# Patient Record
Sex: Male | Born: 1995
Health system: Southern US, Community
[De-identification: ages and names within clinical notes are randomized; demographics above are authoritative.]

## PROBLEM LIST (undated history)

## (undated) DIAGNOSIS — E11649 Type 2 diabetes mellitus with hypoglycemia without coma: Secondary | ICD-10-CM

## (undated) DIAGNOSIS — E063 Autoimmune thyroiditis: Secondary | ICD-10-CM

## (undated) DIAGNOSIS — R6250 Unspecified lack of expected normal physiological development in childhood: Secondary | ICD-10-CM

## (undated) DIAGNOSIS — E049 Nontoxic goiter, unspecified: Secondary | ICD-10-CM

## (undated) HISTORY — DX: Unspecified lack of expected normal physiological development in childhood: R62.50

## (undated) HISTORY — DX: Type 2 diabetes mellitus with hypoglycemia without coma: E11.649

## (undated) HISTORY — PX: CIRCUMCISION: SUR203

## (undated) HISTORY — DX: Nontoxic goiter, unspecified: E04.9

## (undated) HISTORY — DX: Autoimmune thyroiditis: E06.3

---
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
## 2016-01-09 ENCOUNTER — Ambulatory Visit: Payer: BLUE CROSS/BLUE SHIELD | Admitting: Family

## 2016-01-16 ENCOUNTER — Encounter: Payer: Self-pay | Admitting: Family

## 2016-01-16 ENCOUNTER — Ambulatory Visit (INDEPENDENT_AMBULATORY_CARE_PROVIDER_SITE_OTHER): Payer: BLUE CROSS/BLUE SHIELD | Admitting: Family

## 2016-01-16 VITALS — BP 132/83 | HR 73 | Wt 164.2 lb

## 2016-01-16 DIAGNOSIS — E109 Type 1 diabetes mellitus without complications: Secondary | ICD-10-CM | POA: Diagnosis not present

## 2016-01-16 DIAGNOSIS — F329 Major depressive disorder, single episode, unspecified: Secondary | ICD-10-CM | POA: Diagnosis not present

## 2016-01-16 DIAGNOSIS — F54 Psychological and behavioral factors associated with disorders or diseases classified elsewhere: Secondary | ICD-10-CM | POA: Diagnosis not present

## 2016-01-16 DIAGNOSIS — R030 Elevated blood-pressure reading, without diagnosis of hypertension: Secondary | ICD-10-CM | POA: Diagnosis not present

## 2016-01-16 DIAGNOSIS — F32A Depression, unspecified: Secondary | ICD-10-CM

## 2016-01-16 DIAGNOSIS — E1065 Type 1 diabetes mellitus with hyperglycemia: Principal | ICD-10-CM

## 2016-01-16 DIAGNOSIS — IMO0001 Reserved for inherently not codable concepts without codable children: Secondary | ICD-10-CM

## 2016-01-16 LAB — POCT GLYCOSYLATED HEMOGLOBIN (HGB A1C): HEMOGLOBIN A1C: 11.8

## 2016-01-16 LAB — GLUCOSE, POCT (MANUAL RESULT ENTRY): POC Glucose: 270 mg/dl — AB (ref 70–99)

## 2016-01-16 MED ORDER — LISINOPRIL 5 MG PO TABS: 5.0000 mg | ORAL_TABLET | Freq: Every day | ORAL | Status: DC

## 2016-01-16 NOTE — Progress Notes (Signed)
Subjective:  Patient Name: [name] Date of Birth: [birth year]  MRN: [id number]  [name]  presents to the office today for follow-up and management of his type 1 diabetes, hypoglycemia, goiter, growth delay,  non-compliance, and depression.  HISTORY OF PRESENT ILLNESS:   [name] is a 20 y.o. African-American young man.   [name] was accompanied by his mother  1. [name] was admitted to the pediatric intensive care unit of [facility] on 09/22/2006 for evaluation and treatment of new-onset type 1 diabetes mellitus, diabetic ketoacidosis, dehydration, and weight loss. He was started on Lantus as a basal insulin and Novolog aspart as a bolus insulin at mealtimes, bedtime, and 2 AM. He received additional diabetes education through our Diabetes Survival Skills Program and the [facility]. He was subsequently converted to an [employer] insulin pump.  2. The patient's last PSSG visit was on 12/06/2015. [name] states that he is very happy today and proud of himself because he has been working hard to meet the goals we set at his last visit. He states that he is very motivated to improve his diabetes and has been putting in a lot of effort to do better. He is checking his blood sugar more and bolusing with all of his meals, he is also changing his pump site more regularly. He admits that he still has a long way to goal but he is happy with his progress. [name] reports that he seems to run highest around dinner time. He eats pasta/pizza/burgers for lunch and for dinner and he knows that is a big reason of why his blood sugars stay elevated. [name] reports that he is not taking the Lisinopril that is prescribed for him and he does not understand why he needs to take it, but he would be open to taking it.   Pump settings:  Basal: 12am: 1.25 4am: 1.75 8am: 1.85 2pm: 1.25 8pm: 1.40  I:C ratio 12am: 20 5am; 12 1030am: 10  Sensitivity 12am: 80 5am: 40 1030am; 30 4pm:  50 7pm: 50  3. Pertinent Review of Systems:  Constitutional: The patient said that he feels "a lot better actually". Eyes: Vision seems to be good with his glasses. There are no recognized eye problems. His last eye exam was in July of 2015. There were no signs of diabetic eye damage. Eye exam upcoming  Neck: The patient has no complaints of anterior neck swelling, soreness, tenderness, pressure, discomfort, or difficulty swallowing.   Heart: Heart rate increases with exercise or other physical activity. The patient has no complaints of palpitations, irregular heart beats, chest pain, or chest pressure.   Gastrointestinal: Bowel movents seem normal. The patient has no complaints of excessive hunger, acid reflux, upset stomach, stomach aches or pains, diarrhea, or constipation.  Legs: Denies leg cramps. There are no other complaints of numbness, tingling, burning, or pain. No edema is noted.  Feet: There are no obvious foot problems. There are no complaints of numbness, tingling, burning, or pain. No edema is noted. Neurologic: There are no recognized problems with muscle movement and strength, sensation, or coordination. Hypoglycemia: He has been having many low BGs recently, usually during physical activity.   Psych: [name] is still unhappy about having DM.   Diabetes ID: Owns dog tag- at home.    4. BG printout: Checking Bg 2.1 times per day. He has 2 days with no checks. Avg Bg 227. He is changing his pump site every three days. He is bolusing  3 times per day and is getting 60% basal.  Last visit: Checking Bg 0.2 times per day. 25 days with no sugars out of the last 28 days. Bolusing 2.6 times per day. Avg Bg 307+-94.     PAST MEDICAL, FAMILY, AND SOCIAL HISTORY  Past Medical History  Diagnosis Date  . Diabetes mellitus   . Hypoglycemia associated with diabetes (HCC)   . Goiter   . Physical growth delay   . Hypoglycemia associated with diabetes (HCC)   . Goiter   . Thyroiditis,  autoimmune     Family History  Problem Relation Age of Onset  . Diabetes Paternal Grandmother   . Heart disease Paternal Grandmother   . Diabetes Paternal Grandfather   . Heart disease Paternal Grandfather   . Cancer Paternal Grandfather      Current outpatient prescriptions:  .  glucagon (GLUCAGON EMERGENCY) 1 MG injection, Inject 1 mg intramuscularly in thigh one time for severe hypoglycemia (Patient taking differently: Inject 1 mg into the muscle once as needed (for severe hypoglycemia). ), Disp: 2 each, Rfl: 3 .  glucose blood test strip, Use as instructed to check up to 8-10 times a day (Patient taking differently: 1 each by Other route See admin instructions. Pt is instructed to check his blood sugar eight to ten times per day.), Disp: 300 each, Rfl: 6 .  insulin aspart (NOVOLOG) 100 UNIT/ML injection, 300 units in insulin pump every 48 hours (Patient taking differently: 300 Units by Other route every other day. Pt uses in insulin pump.), Disp: 12 vial, Rfl: 4 .  Lancets (ACCU-CHEK MULTICLIX) lancets, Use to test blood sugars 10 x daily (Patient taking differently: 1 each by Other route See admin instructions. Pt uses to test his blood sugars eight to ten times per day.), Disp: 100 each, Rfl: 6 .  lisinopril (PRINIVIL,ZESTRIL) 5 MG tablet, Take 1 tablet (5 mg total) by mouth daily., Disp: 30 tablet, Rfl: 11  Allergies as of 01/16/2016  . (No Known Allergies)     reports that he has never smoked. He has never used smokeless tobacco. He reports that he does not drink alcohol or use illicit drugs. Pediatric History  Patient Guardian Status  . Mother:  Moe,[name]  . Father:  Dade,[name]   Other Topics Concern  . Not on file   Social History Narrative    1. School and family: Freshman at [employer] 2. Activities: Weight lifting at the gym  4. Primary Care Provider: Duard Brady, MD 5. Adolescent medicine: Cresenciano Genre- hasn't seen in over a year.   REVIEW OF SYSTEMS: There  are no other significant problems involving [name]'s other body systems.   Objective:  Vital Signs:  BP 132/83 mmHg  Pulse 73  Wt 74.481 kg (164 lb 3.2 oz)  No height on file for this encounter.  Ht Readings from Last 3 Encounters:  07/12/15  (1.753 m) (45 %*, Z = -0.12)  06/28/15  (1.753 m) (45 %*, Z = -0.12)  06/13/15 5' 9.37" (1.762 m) (51 %*, Z = 0.02)   * Growth percentiles are based on CDC 2-20 Years data.   Wt Readings from Last 3 Encounters:  01/16/16 74.481 kg (164 lb 3.2 oz) (70 %*, Z = 0.53)  12/06/15 72.394 kg (159 lb 9.6 oz) (65 %*, Z = 0.38)  07/12/15 72.576 kg (160 lb) (68 %*, Z = 0.47)   * Growth percentiles are based on CDC 2-20 Years data.   HC Readings from Last  3 Encounters:  No data found for HC   There is no height on file to calculate BSA. No height on file for this encounter. 70%ile (Z=0.53) based on CDC 2-20 Years weight-for-age data using vitals from 01/16/2016.    PHYSICAL EXAM:  Constitutional: The patient appears generally healthy. He is interactive today in clinic and happy about the progress he has made.  Head: The head is normocephalic. Face: The face appears normal. There are no obvious dysmorphic features. Eyes: There is no obvious arcus or proptosis. Moisture appears low. Mouth: The oropharynx and tongue appear normal. Dentition appears to be normal for age. Oral moisture is decreased.  Neck: The neck appears to be visibly enlarged. The thyroid gland is about 22-23 gms in size. Both lobes are enlarged symmetrically. The thyroid gland is non-tender today. Lungs: The lungs are clear to auscultation. Air movement is good Heart: Heart rate and rhythm are regular. Heart sounds S1 and S2 are normal. I did not appreciate any pathologic cardiac murmurs. Abdomen: The abdomen is normal in size for the patient's age. Bowel sounds are normal. There is no obvious hepatomegaly, splenomegaly, or other mass effect.  Arms: Muscle size and bulk are  normal for age. Hands: There is no obvious tremor. Phalangeal and metacarpophalangeal joints are normal. Palmar muscles are normal for age. Palmar skin is normal. Palmar moisture is also normal. Legs: Muscles appear normal for age. No edema is present. Feet: Feet are normally formed. His dorsalis pedal pulses are normal 2+. Neurologic: Strength is normal for age in both the upper and lower extremities. Muscle tone is normal. Sensation to touch is normal in both the legs and feet.    LAB DATA:  Results for orders placed or performed in visit on 01/16/16  POCT Glucose (CBG)  Result Value Ref Range   POC Glucose 270 (A) 70 - 99 mg/dl  POCT HgB Z6X  Result Value Ref Range   Hemoglobin A1C 11.8       Assessment and Plan:   ASSESSMENT:  1. Type 1 diabetes on pump: Poor control. Farmer is working hard to make improvements in his blood glucose control. He greatly increased his blood sugar checking and consistency in his care. He is open to using any tool available to him for help improve his care.  3. Goiter/thyroiditis: Thyroid is enlarged but non-tender today.  4. Growth delay: Growth complete.   5. Weight loss, unintentional: Gained 5 pounds since last visit. He is getting more insulin now.  6. Depression: This problem is much improved. He still does not like to have DM.  7. Maladaptive Behavior: Markee is making improvements in his diabetes care and seems to be motivated to make significant changes.  8. Elevated blood pressure- Continues to have elevated blood pressures. Was started on Lisinopril but is not currently taking it.    PLAN:  1. Diagnostic: Labs as above. Continue home glucose monitoring. 2. Therapeutic: Increase glucose checks to 3-4 per day, no days without checks. Try using dual bolus on pump when eating high carb/fatty meals.  Pump changes  Sensitivity changes  - 1600: 50--> 30   - 1900: 50--> 35 3. Patient education: Discussed challenges with diabetes care. 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 Discussed using Dual Wave bolus and how it effects insulin delivery for certain types of meals. Discussed importance of taking Lisinopril daily and why he needs it. Created goals.  4. Follow-up: 1 month  Level of Service: This visit lasted in excess of 40 minutes. More than 50% of the visit was devoted to counseling.  Gretchen Short FNP-C

## 2016-01-16 NOTE — Patient Instructions (Signed)
Goal  - 3-4 test per day. No days with zero checks  - Improve avg blood sugar and lower A1C--> 9.8 is goal for next check  - Try using duel wave bolus when you eat pasta or pizza.    1 month follow up

## 2016-02-21 ENCOUNTER — Ambulatory Visit: Payer: BLUE CROSS/BLUE SHIELD | Admitting: Family

## 2016-03-06 ENCOUNTER — Ambulatory Visit (INDEPENDENT_AMBULATORY_CARE_PROVIDER_SITE_OTHER): Payer: BLUE CROSS/BLUE SHIELD | Admitting: Family

## 2016-03-06 ENCOUNTER — Encounter: Payer: Self-pay | Admitting: Family

## 2016-03-06 VITALS — BP 129/81 | HR 73 | Wt 165.4 lb

## 2016-03-06 DIAGNOSIS — E109 Type 1 diabetes mellitus without complications: Secondary | ICD-10-CM | POA: Diagnosis not present

## 2016-03-06 DIAGNOSIS — IMO0001 Reserved for inherently not codable concepts without codable children: Secondary | ICD-10-CM

## 2016-03-06 DIAGNOSIS — R030 Elevated blood-pressure reading, without diagnosis of hypertension: Secondary | ICD-10-CM

## 2016-03-06 DIAGNOSIS — E1065 Type 1 diabetes mellitus with hyperglycemia: Principal | ICD-10-CM

## 2016-03-06 DIAGNOSIS — F54 Psychological and behavioral factors associated with disorders or diseases classified elsewhere: Secondary | ICD-10-CM

## 2016-03-06 LAB — GLUCOSE, POCT (MANUAL RESULT ENTRY): POC GLUCOSE: 251 mg/dL — AB (ref 70–99)

## 2016-03-06 LAB — POCT GLYCOSYLATED HEMOGLOBIN (HGB A1C): HEMOGLOBIN A1C: 9.7

## 2016-03-06 MED ORDER — GLUCOSE BLOOD VI STRP: ORAL_STRIP | Status: AC

## 2016-03-06 NOTE — Patient Instructions (Signed)
-   3-4 checks per day  - check prior to all meals.   - 9-11, 12-1, 4-6, 10-11pm,  - Bolus with all meals and add in blood sugars for corrections.  - No missed days of blood sugars!

## 2016-03-06 NOTE — Progress Notes (Signed)
Subjective:  Patient Name: [name] Date of Birth: 10/30/1996  MRN: [id number]  [name]  presents to the office today for follow-up and management of his type 1 diabetes, hypoglycemia, goiter, growth delay,  non-compliance, and depression.  HISTORY OF PRESENT ILLNESS:   [name] is a 20 y.o. African-American young man.   [name] was accompanied by his mother  1. [name] was admitted to the pediatric intensive care unit of [facility] on 09/22/2006 for evaluation and treatment of new-onset type 1 diabetes mellitus, diabetic ketoacidosis, dehydration, and weight loss. He was started on Lantus as a basal insulin and Novolog aspart as a bolus insulin at mealtimes, bedtime, and 2 AM. He received additional diabetes education through our Diabetes Survival Skills Program and the [facility]. He was subsequently converted to an [employer] insulin pump.  2. The patient's last PSSG visit was on 12/06/2015. Since his last appointment [name] feels like he is doing ok, he knows that he has not progressed a whole lot since his last visit, but he also feels like he is not doing any worse. He states that he is checking about 3 times a day most days, although he had a 3 day period where he forgot his meter and was not able to check. He admits that he is not entering his blood sugars into his pump when he boluses most of the time. He is only bolusing for food and not correcting.   Pump settings:  Basal: 12am: 1.25 4am: 1.75 8am: 1.85 2pm: 1.25 8pm: 1.40  I:C ratio 12am: 20 5am; 12 1030am: 10  Sensitivity 12am: 80 5am: 40 1030am; 30 4pm: 50 7pm: 50  3. Pertinent Review of Systems:  Constitutional: The patient said that he feels "good". Eyes: Vision seems to be good with his glasses. There are no recognized eye problems. His last eye exam was in July of 2015. There were no signs of diabetic eye damage. Eye exam upcoming  Neck: The patient has no complaints of  anterior neck swelling, soreness, tenderness, pressure, discomfort, or difficulty swallowing.   Heart: Heart rate increases with exercise or other physical activity. The patient has no complaints of palpitations, irregular heart beats, chest pain, or chest pressure.   Gastrointestinal: Bowel movents seem normal. The patient has no complaints of excessive hunger, acid reflux, upset stomach, stomach aches or pains, diarrhea, or constipation.  Legs: Denies leg cramps. There are no other complaints of numbness, tingling, burning, or pain. No edema is noted.  Feet: There are no obvious foot problems. There are no complaints of numbness, tingling, burning, or pain. No edema is noted. Neurologic: There are no recognized problems with muscle movement and strength, sensation, or coordination. Hypoglycemia: He has been having many low BGs recently, usually during physical activity.   Psych: [name] is still unhappy about having DM.   Diabetes ID: Owns dog tag- at home.    4. BG printout: Checking Bg 2.0 times per day. Avg Bg is 246+/- 126. Bg He has three days with no blood sugars and is checking 2-4 times per day otherwise.  Last visit: Checking Bg 2.1 times per day. He has 2 days with no checks. Avg Bg 227. He is changing his pump site every three days. He is bolusing 3 times per day and is getting 60% basal.     PAST MEDICAL, FAMILY, AND SOCIAL HISTORY  Past Medical History  Diagnosis Date  . Diabetes mellitus   . Hypoglycemia associated  with diabetes (HCC)   . Goiter   . Physical growth delay   . Hypoglycemia associated with diabetes (HCC)   . Goiter   . Thyroiditis, autoimmune     Family History  Problem Relation Age of Onset  . Diabetes Paternal Grandmother   . Heart disease Paternal Grandmother   . Diabetes Paternal Grandfather   . Heart disease Paternal Grandfather   . Cancer Paternal Grandfather      Current outpatient prescriptions:  .  glucagon (GLUCAGON EMERGENCY) 1 MG injection,  Inject 1 mg intramuscularly in thigh one time for severe hypoglycemia (Patient taking differently: Inject 1 mg into the muscle once as needed (for severe hypoglycemia). ), Disp: 2 each, Rfl: 3 .  insulin aspart (NOVOLOG) 100 UNIT/ML injection, 300 units in insulin pump every 48 hours (Patient taking differently: 300 Units by Other route every other day. Pt uses in insulin pump.), Disp: 12 vial, Rfl: 4 .  Lancets (ACCU-CHEK MULTICLIX) lancets, Use to test blood sugars 10 x daily (Patient taking differently: 1 each by Other route See admin instructions. Pt uses to test his blood sugars eight to ten times per day.), Disp: 100 each, Rfl: 6 .  lisinopril (PRINIVIL,ZESTRIL) 5 MG tablet, Take 1 tablet (5 mg total) by mouth daily., Disp: 30 tablet, Rfl: 11 .  glucose blood (BAYER CONTOUR NEXT TEST) test strip, Check blood sugar 6-8 times per day, Disp: 300 each, Rfl: 6  Allergies as of 03/06/2016  . (No Known Allergies)     reports that he has never smoked. He has never used smokeless tobacco. He reports that he does not drink alcohol or use illicit drugs. Pediatric History  Patient Guardian Status  . Mother:  Matt,[name]  . Father:  Bandel,[name]   Other Topics Concern  . Not on file   Social History Narrative    1. 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 Activities: Weight lifting at the gym  4. Primary Care Provider: Duard BradyPUDLO,RONALD J, MD 5. Adolescent medicine: Cresenciano Genrer.Bobby Doolittle- hasn't seen in over a year.   REVIEW OF SYSTEMS: There are no other significant problems involving [name]'s other body systems.   Objective:  Vital Signs:  BP 129/81 mmHg  Pulse 73  Wt 165 lb 6.4 oz (75.025 kg)  No height on file for this encounter.  Ht Readings from Last 3 Encounters:  07/12/15 5\' 9"  (1.753 m) (45 %*, Z = -0.12)  06/28/15 5\' 9"  (1.753 m) (45 %*, Z = -0.12)  06/13/15 5' 9.37" (1.762 m) (51 %*, Z = 0.02)   * Growth percentiles are based on CDC 2-20 Years data.   Wt Readings from Last 3  Encounters:  03/06/16 165 lb 6.4 oz (75.025 kg) (71 %*, Z = 0.55)  01/16/16 164 lb 3.2 oz (74.481 kg) (70 %*, Z = 0.53)  12/06/15 159 lb 9.6 oz (72.394 kg) (65 %*, Z = 0.38)   * Growth percentiles are based on CDC 2-20 Years data.   HC Readings from Last 3 Encounters:  No data found for [name]   There is no height on file to calculate BSA. No height on file for this encounter. 71%ile (Z=0.55) based on CDC 2-20 Years weight-for-age data using vitals from 03/06/2016.    PHYSICAL EXAM:  Constitutional: The patient appears generally healthy. He is interactive today in clinic and happy about the progress he has made.  Head: The head is normocephalic. Face: The face appears normal. There are no obvious dysmorphic features. Eyes: There is no  obvious arcus or proptosis. Moisture appears low. Mouth: The oropharynx and tongue appear normal. Dentition appears to be normal for age. Oral moisture is decreased.  Neck: The neck appears to be visibly enlarged. The thyroid gland is about 22-23 gms in size. Both lobes are enlarged symmetrically. The thyroid gland is non-tender today. Lungs: The lungs are clear to auscultation. Air movement is good Heart: Heart rate and rhythm are regular. Heart sounds S1 and S2 are normal. I did not appreciate any pathologic cardiac murmurs. Abdomen: The abdomen is normal in size for the patient's age. Bowel sounds are normal. There is no obvious hepatomegaly, splenomegaly, or other mass effect.  Arms: Muscle size and bulk are normal for age. Hands: There is no obvious tremor. Phalangeal and metacarpophalangeal joints are normal. Palmar muscles are normal for age. Palmar skin is normal. Palmar moisture is also normal. Legs: Muscles appear normal for age. No edema is present. Feet: Feet are normally formed. His dorsalis pedal pulses are normal 2+. Neurologic: Strength is normal for age in both the upper and lower extremities. Muscle tone is normal. Sensation to touch is  normal in both the legs and feet.    LAB DATA:  Results for orders placed or performed in visit on 03/06/16  POCT Glucose (CBG)  Result Value Ref Range   POC Glucose 251 (A) 70 - 99 mg/dl  POCT HgB Z6X  Result Value Ref Range   Hemoglobin A1C 9.7       Assessment and Plan:   ASSESSMENT:  1. Type 1 diabetes on pump: Poor control. He continues to work at taking better care of his diabetes. He has been bolusing more often, but is not correcting for his blood sugars. He also needs to check his blood sugars more often.  3. Goiter/thyroiditis: Thyroid is enlarged but non-tender today.  4. Growth delay: Growth complete.   5. Weight loss, unintentional: Gained 1 pound since last visit.  6. Depression: Improved, he still struggles with diabetes burn out.  7. Maladaptive Behavior: Kedarius is making improvements in his diabetes care and seems to be motivated to make significant changes. He continues to need encouragement and reminders.  8. Elevated blood pressure- Continues to have elevated blood pressures. Was started on Lisinopril but is not currently taking it.    PLAN:  1. Diagnostic: Labs as above. A1C 2. Therapeutic: No changes to settings at this time.  - 3-4 checks per day  - check prior to all meals.   - 9-11, 12-1, 4-6, 10-11pm,  - Bolus with all meals and add in blood sugars for corrections.  - No missed days of blood sugars!  3. Patient education: Discussed challenges with diabetes care. 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 Discussed setting alarms as reminders for checking blood sugar and giving insulin. Discussed importance of taking Lisinopril daily and why he needs it. Created goals.  4. Follow-up: 1 month   Level of Service: This visit lasted in excess of 25 minutes. More than 50% of the visit was devoted to counseling.  Gretchen Short FNP-C

## 2016-04-10 ENCOUNTER — Encounter: Payer: Self-pay | Admitting: Family

## 2016-04-10 ENCOUNTER — Ambulatory Visit (INDEPENDENT_AMBULATORY_CARE_PROVIDER_SITE_OTHER): Payer: BLUE CROSS/BLUE SHIELD | Admitting: Family

## 2016-04-10 VITALS — BP 131/84 | HR 72 | Wt 165.6 lb

## 2016-04-10 DIAGNOSIS — F54 Psychological and behavioral factors associated with disorders or diseases classified elsewhere: Secondary | ICD-10-CM

## 2016-04-10 DIAGNOSIS — I10 Essential (primary) hypertension: Secondary | ICD-10-CM | POA: Diagnosis not present

## 2016-04-10 DIAGNOSIS — E1065 Type 1 diabetes mellitus with hyperglycemia: Principal | ICD-10-CM

## 2016-04-10 DIAGNOSIS — E109 Type 1 diabetes mellitus without complications: Secondary | ICD-10-CM

## 2016-04-10 DIAGNOSIS — IMO0001 Reserved for inherently not codable concepts without codable children: Secondary | ICD-10-CM

## 2016-04-10 LAB — GLUCOSE, POCT (MANUAL RESULT ENTRY): POC GLUCOSE: 195 mg/dL — AB (ref 70–99)

## 2016-04-10 NOTE — Progress Notes (Signed)
Subjective:  Patient Name: [name] Date of Birth: Sep 08, 1996  MRN: [id number]  [name]  presents to the office today for follow-up and management of his type 1 diabetes, hypoglycemia, goiter, growth delay,  non-compliance, and depression.  HISTORY OF PRESENT ILLNESS:   [name] is a 20 y.o. African-American young man.   [name] was accompanied by his mother  1. [name] was admitted to the pediatric intensive care unit of [facility] on 09/22/2006 for evaluation and treatment of new-onset type 1 diabetes mellitus, diabetic ketoacidosis, dehydration, and weight loss. He was started on Lantus as a basal insulin and Novolog aspart as a bolus insulin at mealtimes, bedtime, and 2 AM. He received additional diabetes education through our Diabetes Survival Skills Program and the [facility]. He was subsequently converted to an [employer] insulin pump.  2. The patient's last PSSG visit was on 12/06/2015. [name] states that things are going well, he feels he is doing better with his diabetes overall. He has noticed that he is having a few lows when he wakes up in the morning. He tends to run higher at night. He has not been rotating his sites as we discussed at his last visit, he still uses his abdomen only.   Pump settings:  Basal: 12am: 1.25 4am: 1.75 8am: 1.85 2pm: 1.25 8pm: 1.40  I:C ratio 12am: 20 5am; 12 1030am: 10  Sensitivity 12am: 80 5am: 40 1030am; 30 4pm: 50 7pm: 50  3. Pertinent Review of Systems:  Constitutional: The patient said that he feels "good". Eyes: Vision seems to be good with his glasses. There are no recognized eye problems. His last eye exam was in July of 2015. There were no signs of diabetic eye damage. Eye exam upcoming  Neck: The patient has no complaints of anterior neck swelling, soreness, tenderness, pressure, discomfort, or difficulty swallowing.   Heart: Heart rate increases with exercise or other physical activity. The  patient has no complaints of palpitations, irregular heart beats, chest pain, or chest pressure.   Gastrointestinal: Bowel movents seem normal. The patient has no complaints of excessive hunger, acid reflux, upset stomach, stomach aches or pains, diarrhea, or constipation.  Legs: Denies leg cramps. There are no other complaints of numbness, tingling, burning, or pain. No edema is noted.  Feet: There are no obvious foot problems. There are no complaints of numbness, tingling, burning, or pain. No edema is noted. Neurologic: There are no recognized problems with muscle movement and strength, sensation, or coordination. Hypoglycemia: He has been having many low BGs recently, usually during physical activity.   Psych: [name] is still unhappy about having DM.   Diabetes ID: [name]   4. BG printout: Checking Bg 2.5 times per day. Avg Bg 184 +/- 103. He has two days with no checks but is usually checking 2-5 times per day.  Last visit: Checking Bg 2.0 times per day. Avg Bg is 246+/- 126. Bg He has three days with no blood sugars and is checking 2-4 times per day otherwise.      PAST MEDICAL, FAMILY, AND SOCIAL HISTORY  Past Medical History  Diagnosis Date  . Diabetes mellitus   . Hypoglycemia associated with diabetes (HCC)   . Goiter   . Physical growth delay   . Hypoglycemia associated with diabetes (HCC)   . Goiter   . Thyroiditis, autoimmune     Family History  Problem Relation Age of Onset  . Diabetes Paternal Grandmother   .  Heart disease Paternal Grandmother   . Diabetes Paternal Grandfather   . Heart disease Paternal Grandfather   . Cancer Paternal Grandfather      Current outpatient prescriptions:  .  glucagon (GLUCAGON EMERGENCY) 1 MG injection, Inject 1 mg intramuscularly in thigh one time for severe hypoglycemia (Patient taking differently: Inject 1 mg into the muscle once as needed (for severe hypoglycemia). ), Disp: 2 each, Rfl: 3 .  glucose blood (BAYER CONTOUR NEXT TEST)  test strip, Check blood sugar 6-8 times per day, Disp: 300 each, Rfl: 6 .  insulin aspart (NOVOLOG) 100 UNIT/ML injection, 300 units in insulin pump every 48 hours (Patient taking differently: 300 Units by Other route every other day. Pt uses in insulin pump.), Disp: 12 vial, Rfl: 4 .  Lancets (ACCU-CHEK MULTICLIX) lancets, Use to test blood sugars 10 x daily (Patient taking differently: 1 each by Other route See admin instructions. Pt uses to test his blood sugars eight to ten times per day.), Disp: 100 each, Rfl: 6 .  lisinopril (PRINIVIL,ZESTRIL) 5 MG tablet, Take 1 tablet (5 mg total) by mouth daily., Disp: 30 tablet, Rfl: 11  Allergies as of 04/10/2016  . (No Known Allergies)     reports that he has never smoked. He has never used smokeless tobacco. He reports that he does not drink alcohol or use illicit drugs. Pediatric History  Patient Guardian Status  . Mother:  Mosso,[name]  . Father:  Amodei,[name]   Other Topics Concern  . Not on file   Social History Narrative    1. 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 Activities: Weight lifting at the gym  4. Primary Care Provider: Duard BradyPUDLO,RONALD J, MD 5. Adolescent medicine: Cresenciano Genrer.Bobby Doolittle- hasn't seen in over a year.   REVIEW OF SYSTEMS: There are no other significant problems involving [name]'s other body systems.   Objective:  Vital Signs:  BP 131/84 mmHg  Pulse 72  Wt 165 lb 9.6 oz (75.116 kg)  No height on file for this encounter.  Ht Readings from Last 3 Encounters:  07/12/15 5\' 9"  (1.753 m) (45 %*, Z = -0.12)  06/28/15 5\' 9"  (1.753 m) (45 %*, Z = -0.12)  06/13/15 5' 9.37" (1.762 m) (51 %*, Z = 0.02)   * Growth percentiles are based on CDC 2-20 Years data.   Wt Readings from Last 3 Encounters:  04/10/16 165 lb 9.6 oz (75.116 kg) (71 %*, Z = 0.54)  03/06/16 165 lb 6.4 oz (75.025 kg) (71 %*, Z = 0.55)  01/16/16 164 lb 3.2 oz (74.481 kg) (70 %*, Z = 0.53)   * Growth percentiles are based on CDC 2-20 Years data.   HC  Readings from Last 3 Encounters:  [facility]   There is no height on file to calculate BSA. No height on file for this encounter. 71%ile (Z=0.54) based on CDC 2-20 Years weight-for-age data using vitals from 04/10/2016.    PHYSICAL EXAM:  Constitutional: The patient appears generally healthy. He is interactive today in clinic and happy about the progress he has made.  Head: The head is normocephalic. Face: The face appears normal. There are no obvious dysmorphic features. Eyes: There is no obvious arcus or proptosis. Moisture appears low. Mouth: The oropharynx and tongue appear normal. Dentition appears to be normal for age. Oral moisture is decreased.  Neck: The neck appears to be visibly enlarged. The thyroid gland is about 22-23 gms in size. Both lobes are enlarged symmetrically. The thyroid  gland is non-tender today. Lungs: The lungs are clear to auscultation. Air movement is good Heart: Heart rate and rhythm are regular. Heart sounds S1 and S2 are normal. I did not appreciate any pathologic cardiac murmurs. Abdomen: The abdomen is normal in size for the patient's age. Bowel sounds are normal. There is no obvious hepatomegaly, splenomegaly, or other mass effect.  Arms: Muscle size and bulk are normal for age. Hands: There is no obvious tremor. Phalangeal and metacarpophalangeal joints are normal. Palmar muscles are normal for age. Palmar skin is normal. Palmar moisture is also normal. Legs: Muscles appear normal for age. No edema is present. Feet: Feet are normally formed. His dorsalis pedal pulses are normal 2+. Neurologic: Strength is normal for age in both the upper and lower extremities. Muscle tone is normal. Sensation to touch is normal in both the legs and feet.    LAB DATA:  Results for orders placed or performed in visit on 04/10/16  POCT Glucose (CBG)  Result Value Ref Range   POC Glucose 195 (A) 70 - 99 mg/dl      Assessment and Plan:   ASSESSMENT:  1.  Type 1 diabetes on pump: Poor control. He continues to work at taking better care of his diabetes. He is checking his blood sugars more often and being more consistent with his boluses. He forgets to bolus at dinner often and then boluses later on in the night when his blood sugars are already high. He is having some morning lows. .   2. Weight loss, unintentional: stable weight.   3. Maladaptive Behavior: [name] is making improvements in his diabetes care and seems to be motivated to make significant changes. He continues to need encouragement and reminders.  4. Essential Hypertension- Taking  of Lisinopril daily. He forgets occasionally.    PLAN:  1. Diagnostic: Labs as above.  2. Therapeutic:   Basal change   - 4am: 1.75--> 1.7   - 3-4 checks per day  - check prior to all meals.   - Bolus with all meals and add in blood sugars for corrections.   - No missed days of blood sugars!  3. Patient education: Discussed challenges with diabetes care. 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 Discussed setting alarms as reminders for checking blood sugar and giving insulin. Discussed importance of taking Lisinopril daily and why he needs it. Created goals. Discussed bolusing prior to meal and not waiting to late at night to do corrections which can cause morning hypoglycemia. Discussed upcoming diabetes technology.  4. Follow-up: 1 month   Level of Service: This visit lasted in excess of 25 minutes. More than 50% of the visit was devoted to counseling.  Gretchen Short FNP-C

## 2016-04-10 NOTE — Patient Instructions (Signed)
-   Continue to check at least 4 x per day  - Bolus with all meals.  - Do not forget to bolus at night.  - Change site every 3 days  - Rotate sites  - follow up in one month.

## 2016-05-15 ENCOUNTER — Encounter: Payer: Self-pay | Admitting: Family

## 2016-05-15 ENCOUNTER — Ambulatory Visit (INDEPENDENT_AMBULATORY_CARE_PROVIDER_SITE_OTHER): Payer: BLUE CROSS/BLUE SHIELD | Admitting: Family

## 2016-05-15 VITALS — BP 129/79 | HR 70 | Wt 168.7 lb

## 2016-05-15 DIAGNOSIS — IMO0001 Reserved for inherently not codable concepts without codable children: Secondary | ICD-10-CM

## 2016-05-15 DIAGNOSIS — I10 Essential (primary) hypertension: Secondary | ICD-10-CM

## 2016-05-15 DIAGNOSIS — E11649 Type 2 diabetes mellitus with hypoglycemia without coma: Secondary | ICD-10-CM | POA: Diagnosis not present

## 2016-05-15 DIAGNOSIS — E109 Type 1 diabetes mellitus without complications: Secondary | ICD-10-CM | POA: Diagnosis not present

## 2016-05-15 DIAGNOSIS — F54 Psychological and behavioral factors associated with disorders or diseases classified elsewhere: Secondary | ICD-10-CM | POA: Diagnosis not present

## 2016-05-15 DIAGNOSIS — E1065 Type 1 diabetes mellitus with hyperglycemia: Principal | ICD-10-CM

## 2016-05-15 DIAGNOSIS — Z4681 Encounter for fitting and adjustment of insulin pump: Secondary | ICD-10-CM

## 2016-05-15 LAB — POCT GLYCOSYLATED HEMOGLOBIN (HGB A1C): HEMOGLOBIN A1C: 10.3

## 2016-05-15 LAB — GLUCOSE, POCT (MANUAL RESULT ENTRY): POC Glucose: 60 mg/dl — AB (ref 70–99)

## 2016-05-15 NOTE — Patient Instructions (Addendum)
-   Do not drive with a low blood sugar. If you are under 100 and about to drive, you MUST treat your blood sugar prior to driving.  - Check blood sugar at least 4 times per day  - Bolus with all meals and to correct blood sugar.  - Pump Change   - 8 am basal--> 1.85--> 1.75   - If you notice that you are running high after 2-3 days, you can increase to 1.8 - Right now you are using 70-80% basal. You need to bolus more often for eating and to correct your blood sugars. Your bolus should be at least 50% of your total daily insulin.  - We are decreasing your 8am basal to compensate for lows after work.  - Call if you need help with adjustment!

## 2016-05-15 NOTE — Progress Notes (Signed)
Subjective:  Patient Name: [name] Date of Birth: [birth year]  MRN: [id number]  [name]  presents to the office today for follow-up and management of his type 1 diabetes, hypoglycemia, goiter, growth delay,  non-compliance, and depression.  HISTORY OF PRESENT ILLNESS:   [name] is a 20 y.o. African-American young man.   [name] was accompanied by his mother  1. [name] was admitted to the pediatric intensive care unit of [facility] on 09/22/2006 for evaluation and treatment of new-onset type 1 diabetes mellitus, diabetic ketoacidosis, dehydration, and weight loss. He was started on Lantus as a basal insulin and Novolog aspart as a bolus insulin at mealtimes, bedtime, and 2 AM. He received additional diabetes education through our Diabetes Survival Skills Program and the [facility]. He was subsequently converted to an [employer] insulin pump.  2. The patient's last PSSG visit was on 04/10/2016. [name] has finished his first semester in college. He is now working at a [employer]. He states that since starting this new job he has been having more fluctuation in his blood sugars. He wakes up around 12 in the afternoon and is noticing that he has been low often at that time. He has been cutting back on how he boluses for his carbs during work which has resulted in lots of elevated blood sugars. He is still depending largely on his basal for insulin. He hopes that he can get things with his blood sugars to "even out" as he gets adjusted to his new work schedule.   Pump settings:  Basal: 12am: 1.25 4am: 1.75 8am: 1.85 2pm: 1.25 8pm: 1.40  I:C ratio 12am: 20 5am; 12 1030am: 10  Sensitivity 12am: 80 5am: 40 1030am; 30 4pm: 50 7pm: 50  3. Pertinent Review of Systems:  Constitutional: The patient said that he feels "good". Eyes: Vision seems to be good with his glasses. There are no recognized eye problems. His last eye exam was in  July of 2015. There were no signs of diabetic eye damage. Eye exam upcoming  Neck: The patient has no complaints of anterior neck swelling, soreness, tenderness, pressure, discomfort, or difficulty swallowing.   Heart: Heart rate increases with exercise or other physical activity. The patient has no complaints of palpitations, irregular heart beats, chest pain, or chest pressure.   Gastrointestinal: Bowel movents seem normal. The patient has no complaints of excessive hunger, acid reflux, upset stomach, stomach aches or pains, diarrhea, or constipation.  Legs: Denies leg cramps. There are no other complaints of numbness, tingling, burning, or pain. No edema is noted.  Feet: There are no obvious foot problems. There are no complaints of numbness, tingling, burning, or pain. No edema is noted. Neurologic: There are no recognized problems with muscle movement and strength, sensation, or coordination. Hypoglycemia: He has been having many low BGs recently, usually during physical activity.   Psych: [name] is still unhappy about having DM.   Diabetes ID: Dog tag   4. BG printout: Checking 2.4 times per day. Avg Bg 237. He has one day with no checks. He is bolusing 3.3 times per day.  Last visit: Checking Bg 2.5 times per day. Avg Bg 184 +/- 103. He has two days with no checks but is usually checking 2-5 times per day.       PAST MEDICAL, FAMILY, AND SOCIAL HISTORY  Past Medical History  Diagnosis Date  . Diabetes mellitus   . Hypoglycemia associated with  diabetes (HCC)   . Goiter   . Physical growth delay   . Hypoglycemia associated with diabetes (HCC)   . Goiter   . Thyroiditis, autoimmune     Family History  Problem Relation Age of Onset  . Diabetes Paternal Grandmother   . Heart disease Paternal Grandmother   . Diabetes Paternal Grandfather   . Heart disease Paternal Grandfather   . Cancer Paternal Grandfather      Current outpatient prescriptions:  .  glucagon (GLUCAGON  EMERGENCY) 1 MG injection, Inject 1 mg intramuscularly in thigh one time for severe hypoglycemia (Patient taking differently: Inject 1 mg into the muscle once as needed (for severe hypoglycemia). ), Disp: 2 each, Rfl: 3 .  glucose blood (BAYER CONTOUR NEXT TEST) test strip, Check blood sugar 6-8 times per day, Disp: 300 each, Rfl: 6 .  insulin aspart (NOVOLOG) 100 UNIT/ML injection, 300 units in insulin pump every 48 hours (Patient taking differently: 300 Units by Other route every other day. Pt uses in insulin pump.), Disp: 12 vial, Rfl: 4 .  Lancets (ACCU-CHEK MULTICLIX) lancets, Use to test blood sugars 10 x daily (Patient taking differently: 1 each by Other route See admin instructions. Pt uses to test his blood sugars eight to ten times per day.), Disp: 100 each, Rfl: 6 .  lisinopril (PRINIVIL,ZESTRIL) 5 MG tablet, Take 1 tablet (5 mg total) by mouth daily., Disp: 30 tablet, Rfl: 11  Allergies as of 05/15/2016  . (No Known Allergies)     reports that he has never smoked. He has never used smokeless tobacco. He reports that he does not drink alcohol or use illicit drugs. Pediatric History  Patient Guardian Status  . Mother:  [name]  . Father:  [name]   Other Topics Concern  . Not on file   Social History Narrative    1. 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 Activities: Weight lifting at the gym  4. Primary Care Provider: Duard BradyPUDLO,RONALD J, [name] 5. Adolescent medicine: [name]- hasn't seen in over a year.   REVIEW OF SYSTEMS: There are no other significant problems involving [name]'s other body systems.   Objective:  Vital Signs:  BP 129/79 mmHg  Pulse 70  Wt 168 lb 11.2 oz (76.522 kg)  No height on file for this encounter.  Ht Readings from Last 3 Encounters:  07/12/15 5\' 9"  (1.753 m) (45 %*, Z = -0.12)  06/28/15 5\' 9"  (1.753 m) (45 %*, Z = -0.12)  06/13/15 5' 9.37" (1.762 m) (51 %*, Z = 0.02)   * Growth percentiles are based on CDC 2-20 Years data.   Wt  Readings from Last 3 Encounters:  05/15/16 168 lb 11.2 oz (76.522 kg) (74 %*, Z = 0.63)  04/10/16 165 lb 9.6 oz (75.116 kg) (71 %*, Z = 0.54)  03/06/16 165 lb 6.4 oz (75.025 kg) (71 %*, Z = 0.55)   * Growth percentiles are based on CDC 2-20 Years data.   HC Readings from Last 3 Encounters:  [facility]   There is no height on file to calculate BSA. No height on file for this encounter. 74%ile (Z=0.63) based on CDC 2-20 Years weight-for-age data using vitals from 05/15/2016.    PHYSICAL EXAM:  Constitutional: The patient appears generally healthy. He is tired today from working last night.  Head: The head is normocephalic. Face: The face appears normal. There are no obvious dysmorphic features. Eyes: There is no obvious arcus or proptosis. Moisture appears low.  Mouth: The oropharynx and tongue appear normal. Dentition appears to be normal for age. Oral moisture is decreased.  Neck: The neck appears to be visibly enlarged. The thyroid gland is about 22-23 gms in size. Both lobes are enlarged symmetrically. The thyroid gland is non-tender today. Lungs: The lungs are clear to auscultation. Air movement is good Heart: Heart rate and rhythm are regular. Heart sounds S1 and S2 are normal. I did not appreciate any pathologic cardiac murmurs. Abdomen: The abdomen is normal in size for the patient's age. Bowel sounds are normal. There is no obvious hepatomegaly, splenomegaly, or other mass effect.  Arms: Muscle size and bulk are normal for age. Hands: There is no obvious tremor. Phalangeal and metacarpophalangeal joints are normal. Palmar muscles are normal for age. Palmar skin is normal. Palmar moisture is also normal. Legs: Muscles appear normal for age. No edema is present. Feet: Feet are normally formed. His dorsalis pedal pulses are normal 2+. Neurologic: Strength is normal for age in both the upper and lower extremities. Muscle tone is normal. Sensation to touch is normal in both  the legs and feet.    LAB DATA:  Results for orders placed or performed in visit on 05/15/16  POCT Glucose (CBG)  Result Value Ref Range   POC Glucose 60 (A) 70 - 99 mg/dl  POCT HgB Z6X  Result Value Ref Range   Hemoglobin A1C 10.3       Assessment and Plan:   ASSESSMENT:  1. Type 1 diabetes on pump: Poor control. Since starting his new job he is struggling with adjusting his insulin. He goes low when he sleeps in late during the afternoon and he is high at night while working because he decreases his insulin during counting carbs. He is doing better with checking his blood sugars and bolusing but still relies heavily on his basal. .   2. Weight loss, unintentional: stable weight.   3. Maladaptive Behavior: Doing well checking his blood sugars and bolusing. He still snacks and forgets to bolus, especially at night while working.  4. Essential Hypertension- Taking  of Lisinopril daily. He forgets occasionally.    PLAN:  1. Diagnostic: Labs as above.  2. Therapeutic: Continue Lisinopril  EVERYDAY  Basal change   - 8am: 1.85--> 1.75  - Check blood sugars at least 4 x per day   - Let the pump do the calculations, enter the right amount of carbs.  3. Patient education: Discussed challenges with diabetes care. Discussed his new job and how it is affecting his blood sugars. We went into detail on allowing the pump to do the calculations and give full amount of insulin due to hyperglycemia while working. Encouraged Rani to contact me anytime he is having difficulties with his diabetes or needs pump adjustments.  4. Follow-up: 1 month   Level of Service: This visit lasted in excess of 25 minutes. More than 50% of the visit was devoted to counseling.  Gretchen Short FNP-C

## 2016-06-03 DIAGNOSIS — E1065 Type 1 diabetes mellitus with hyperglycemia: Secondary | ICD-10-CM | POA: Diagnosis not present

## 2016-06-04 DIAGNOSIS — E1065 Type 1 diabetes mellitus with hyperglycemia: Secondary | ICD-10-CM | POA: Diagnosis not present

## 2016-06-19 ENCOUNTER — Ambulatory Visit: Payer: BLUE CROSS/BLUE SHIELD | Admitting: Family

## 2016-07-08 DIAGNOSIS — E1065 Type 1 diabetes mellitus with hyperglycemia: Secondary | ICD-10-CM | POA: Diagnosis not present

## 2016-07-17 ENCOUNTER — Ambulatory Visit: Payer: BLUE CROSS/BLUE SHIELD | Admitting: Family

## 2016-08-01 ENCOUNTER — Other Ambulatory Visit: Payer: Self-pay | Admitting: Pediatrics

## 2016-08-06 ENCOUNTER — Ambulatory Visit: Payer: BLUE CROSS/BLUE SHIELD | Admitting: Family

## 2016-09-04 ENCOUNTER — Ambulatory Visit (INDEPENDENT_AMBULATORY_CARE_PROVIDER_SITE_OTHER): Payer: BLUE CROSS/BLUE SHIELD | Admitting: Family

## 2016-09-04 ENCOUNTER — Encounter: Payer: Self-pay | Admitting: Family

## 2016-09-04 VITALS — BP 139/76 | HR 85 | Wt 168.2 lb

## 2016-09-04 DIAGNOSIS — E1065 Type 1 diabetes mellitus with hyperglycemia: Principal | ICD-10-CM

## 2016-09-04 DIAGNOSIS — E109 Type 1 diabetes mellitus without complications: Secondary | ICD-10-CM | POA: Diagnosis not present

## 2016-09-04 DIAGNOSIS — I10 Essential (primary) hypertension: Secondary | ICD-10-CM | POA: Diagnosis not present

## 2016-09-04 DIAGNOSIS — Z23 Encounter for immunization: Secondary | ICD-10-CM

## 2016-09-04 DIAGNOSIS — IMO0001 Reserved for inherently not codable concepts without codable children: Secondary | ICD-10-CM

## 2016-09-04 DIAGNOSIS — F54 Psychological and behavioral factors associated with disorders or diseases classified elsewhere: Secondary | ICD-10-CM | POA: Diagnosis not present

## 2016-09-04 LAB — COMPREHENSIVE METABOLIC PANEL
ALBUMIN: 4.2 g/dL (ref 3.6–5.1)
ALT: 10 U/L (ref 8–46)
AST: 17 U/L (ref 12–32)
Alkaline Phosphatase: 126 U/L (ref 48–230)
BILIRUBIN TOTAL: 1.4 mg/dL — AB (ref 0.2–1.1)
BUN: 9 mg/dL (ref 7–20)
CALCIUM: 9.7 mg/dL (ref 8.9–10.4)
CO2: 28 mmol/L (ref 20–31)
Chloride: 106 mmol/L (ref 98–110)
Creat: 0.96 mg/dL (ref 0.60–1.26)
Glucose, Bld: 166 mg/dL — ABNORMAL HIGH (ref 70–99)
Potassium: 4.5 mmol/L (ref 3.8–5.1)
Sodium: 142 mmol/L (ref 135–146)
TOTAL PROTEIN: 6.9 g/dL (ref 6.3–8.2)

## 2016-09-04 LAB — CBC WITH DIFFERENTIAL/PLATELET
Basophils Absolute: 54 cells/uL (ref 0–200)
Basophils Relative: 1 %
Eosinophils Absolute: 108 cells/uL (ref 15–500)
Eosinophils Relative: 2 %
HCT: 48.8 % (ref 38.5–50.0)
Hemoglobin: 16.5 g/dL (ref 13.2–17.1)
Lymphocytes Relative: 30 %
Lymphs Abs: 1620 cells/uL (ref 850–3900)
MCH: 27.6 pg (ref 27.0–33.0)
MCHC: 33.8 g/dL (ref 32.0–36.0)
MCV: 81.6 fL (ref 80.0–100.0)
MPV: 10.2 fL (ref 7.5–12.5)
Monocytes Absolute: 378 cells/uL (ref 200–950)
Monocytes Relative: 7 %
Neutro Abs: 3240 cells/uL (ref 1500–7800)
Neutrophils Relative %: 60 %
Platelets: 250 10*3/uL (ref 140–400)
RBC: 5.98 MIL/uL — ABNORMAL HIGH (ref 4.20–5.80)
RDW: 13.5 % (ref 11.0–15.0)
WBC: 5.4 10*3/uL (ref 3.8–10.8)

## 2016-09-04 LAB — LIPID PANEL
Cholesterol: 162 mg/dL (ref 125–170)
HDL: 59 mg/dL (ref 31–65)
LDL Cholesterol: 92 mg/dL (ref <110)
Total CHOL/HDL Ratio: 2.7 Ratio (ref <=5.0)
Triglycerides: 53 mg/dL (ref 38–152)
VLDL: 11 mg/dL (ref <30)

## 2016-09-04 LAB — POCT GLYCOSYLATED HEMOGLOBIN (HGB A1C): Hemoglobin A1C: 10.6

## 2016-09-04 LAB — GLUCOSE, POCT (MANUAL RESULT ENTRY): POC GLUCOSE: 133 mg/dL — AB (ref 70–99)

## 2016-09-04 NOTE — Patient Instructions (Signed)
-   Goal for next visit is to have at least 3 checks per day  - Bolus with lunch and all meals   - Try giving part of your insulin 15 minutes before you eat.  - Continue with current pump settings.  - Check blood sugar at least 4 x per day  - Keep glucose with you at all times  - Make sure you are giving insulin with each meal and to correct for high blood sugars  - If you need anything, please do nt hesitate to contact me via MyChart or by calling the office.   [phone]  - Follow up in one month

## 2016-09-05 ENCOUNTER — Encounter: Payer: Self-pay | Admitting: Family

## 2016-09-05 LAB — MICROALBUMIN / CREATININE URINE RATIO
CREATININE, URINE: 247 mg/dL (ref 20–370)
Microalb Creat Ratio: 2 mcg/mg creat (ref <30)
Microalb, Ur: 0.6 mg/dL

## 2016-09-05 LAB — T4, FREE: Free T4: 1.2 ng/dL (ref 0.8–1.4)

## 2016-09-05 LAB — TSH: TSH: 0.62 mIU/L (ref 0.50–4.30)

## 2016-09-05 NOTE — Progress Notes (Signed)
Subjective:  Patient Name: [name] Date of Birth: [birth year]  MRN: [id number]  [name]  presents to the office today for follow-up and management of his type 1 diabetes, hypoglycemia, goiter, growth delay,  non-compliance, and depression.  HISTORY OF PRESENT ILLNESS:   [name] is a 20 y.o. African-American young man.   [name] was accompanied by his mother  1. [name] was admitted to the pediatric intensive care unit of [facility] on 09/22/2006 for evaluation and treatment of new-onset type 1 diabetes mellitus, diabetic ketoacidosis, dehydration, and weight loss. He was started on Lantus as a basal insulin and Novolog aspart as a bolus insulin at mealtimes, bedtime, and 2 AM. He received additional diabetes education through our Diabetes Survival Skills Program and the [facility]. He was subsequently converted to an [employer] insulin pump.  2. The patient's last [facility] visit was on 05/15/2016. Montgomery reports that he has been healthy since his last visit. 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 He started school back two weeks ago and is no longer working at the [employer], he is glad to be back to a more routine schedule.   [name] admits that he slacked off over the summer with his diabetes care. He found it hard to balance work with taking care of his blood sugars. He states that he was missing some of his glucose checks and would forget to bolus for lunch. He is hoping now that he is back in school that he will be able to improve his diabetes care again. He has worked on rotating his sites more, he is using his abdomen and buttocks.   Pump settings:  Basal: 12am: 1.25 4am: 1.70 8am: 1.75 2pm: 1.25 8pm: 1.40  I:C ratio 12am: 20 5am; 12 1030am: 10  Sensitivity 12am: 80 5am: 40 1030am; 30 4pm: 30 7pm:  35  3. Pertinent Review of Systems:  Constitutional: The patient said that he feels "good". Eyes: Vision seems to be good with his glasses. There are no recognized eye problems. His last eye exam was in July of 2016. There were no signs of diabetic eye damage. Discussed with his mother today that he is due for another eye exam.  Neck: The patient has no complaints of anterior neck swelling, soreness, tenderness, pressure, discomfort, or difficulty swallowing.   Heart: Heart rate increases with exercise or other physical activity. The patient has no complaints of palpitations, irregular heart beats, chest pain, or chest pressure.   Gastrointestinal: Bowel movents seem normal. The patient has no complaints of excessive hunger, acid reflux, upset stomach, stomach aches or pains, diarrhea, or constipation.  Legs: Denies leg cramps. There are no other complaints of numbness, tingling, burning, or pain. No edema is noted.  Feet: There are no obvious foot problems. There are no complaints of numbness, tingling, burning, or pain. No edema is noted. Neurologic: There are no recognized problems with muscle movement and strength, sensation, or coordination. Hypoglycemia: He has been having many low BGs recently, usually during physical activity.   Psych: [name] is still unhappy about having DM.   Diabetes ID: Dog tag   4. BG printout: Checking Bg 1.6 times per day. Avg Bg 313. He has 7 days over the last 3 weeks with no checks. He is bolusing 1-6 times per day,  he frequently forgets to bolus at lunch.  Last visit: Checking 2.4 times per day. Avg Bg 237. He has one day with no checks. He is bolusing 3.3 times per day.        PAST MEDICAL, FAMILY, AND SOCIAL HISTORY  Past Medical History:  Diagnosis Date  . Diabetes mellitus   . Goiter   . Goiter   . Hypoglycemia associated with diabetes ([place])   . Hypoglycemia associated with diabetes ([place])   . Physical growth delay   . Thyroiditis, autoimmune      Family History  Problem Relation Age of Onset  . Diabetes Paternal Grandmother   . Heart disease Paternal Grandmother   . Diabetes Paternal Grandfather   . Heart disease Paternal Grandfather   . Cancer Paternal Grandfather      Current Outpatient Prescriptions:  .  glucagon (GLUCAGON EMERGENCY) 1 MG injection, Inject 1 mg intramuscularly in thigh one time for severe hypoglycemia (Patient taking differently: Inject 1 mg into the muscle once as needed (for severe hypoglycemia). ), Disp: 2 each, Rfl: 3 .  glucose blood (BAYER CONTOUR NEXT TEST) test strip, Check blood sugar 6-8 times per day, Disp: 300 each, Rfl: 6 .  Lancets (ACCU-CHEK MULTICLIX) lancets, Use to test blood sugars 10 x daily (Patient taking differently: 1 each by Other route See admin instructions. Pt uses to test his blood sugars eight to ten times per day.), Disp: 100 each, Rfl: 6 .  lisinopril (PRINIVIL,ZESTRIL) 5 MG tablet, Take 1 tablet (5 mg total) by mouth daily., Disp: 30 tablet, Rfl: 11 .  NOVOLOG 100 UNIT/ML injection, INJECT 300 UNITS IN INSULIN PUMP EVERY 48 HOURS, Disp: 120 mL, Rfl: 3  Allergies as of 09/04/2016  . (No Known Allergies)     reports that he has never smoked. He has never used smokeless tobacco. He reports that he does not drink alcohol or use drugs. Pediatric History  Patient Guardian Status  . Mother:  [name]  . Father:  [name]   Other Topics Concern  . Not on file   Social History Narrative  . No narrative on file    1. School and family: [employer] at [employer] 2. Activities: Weight lifting at the gym  4. Primary Care Provider: Henreitta Cea, MD 5. Adolescent medicine: Garnette Czech- hasn't seen in over a year.   REVIEW OF SYSTEMS: There are no other significant problems involving [name]'s other body systems.   Objective:  Vital Signs:  BP 139/76   Pulse 85   Wt 76.3 kg (168 lb 3.2 oz)   No height on file for this encounter.  Ht Readings from Last 3 Encounters:   07/12/15 '5\' 9"'  (1.753 m) (45 %, Z= -0.12)*  06/28/15 '5\' 9"'  (1.753 m) (45 %, Z= -0.12)*  06/13/15 5' 9.37" (1.762 m) (51 %, Z= 0.02)*   * Growth percentiles are based on CDC 2-20 Years data.   Wt Readings from Last 3 Encounters:  09/04/16 76.3 kg (168 lb 3.2 oz) (72 %, Z= 0.57)*  05/15/16 76.5 kg (168 lb 11.2 oz) (74 %, Z= 0.63)*  04/10/16 75.1 kg (165 lb 9.6 oz) (71 %, Z= 0.54)*   * Growth percentiles are based on CDC 2-20 Years data.   HC Readings from Last 3 Encounters:  No data found for [facility]   There is no height or weight on file to calculate BSA. No height on file for this encounter. 72 %ile (Z= 0.57) based on CDC 2-20 Years weight-for-age data using vitals  from 09/04/2016.    PHYSICAL EXAM:  Constitutional: The patient appears generally healthy. He is interactive and talkative today.  Head: The head is normocephalic. Face: The face appears normal. There are no obvious dysmorphic features. Eyes: There is no obvious arcus or proptosis. Moisture appears low. Mouth: The oropharynx and tongue appear normal. Dentition appears to be normal for age. Oral moisture is decreased.  Neck: The neck appears to be visibly enlarged. The thyroid gland is normal size and consistency.The thyroid gland is non-tender today. Lungs: The lungs are clear to auscultation. Air movement is good Heart: Heart rate and rhythm are regular. Heart sounds S1 and S2 are normal. I did not appreciate any pathologic cardiac murmurs. Abdomen: The abdomen is normal in size for the patient's age. Bowel sounds are normal. There is no obvious hepatomegaly, splenomegaly, or other mass effect.  Arms: Muscle size and bulk are normal for age. Hands: There is no obvious tremor. Phalangeal and metacarpophalangeal joints are normal. Palmar muscles are normal for age. Palmar skin is normal. Palmar moisture is also normal. Legs: Muscles appear normal for age. No edema is present. Feet: Feet are normally formed. His dorsalis  pedal pulses are normal 2+. Neurologic: Strength is normal for age in both the upper and lower extremities. Muscle tone is normal. Sensation to touch is normal in both the legs and feet.    LAB DATA:  Results for orders placed or performed in visit on 09/04/16  TSH  Result Value Ref Range   TSH 0.62 0.50 - 4.30 mIU/L  T4, free  Result Value Ref Range   Free T4 1.2 0.8 - 1.4 ng/dL  Comprehensive metabolic panel  Result Value Ref Range   Sodium 142 135 - 146 mmol/L   Potassium 4.5 3.8 - 5.1 mmol/L   Chloride 106 98 - 110 mmol/L   CO2 28 20 - 31 mmol/L   Glucose, Bld 166 (H) 70 - 99 mg/dL   BUN 9 7 - 20 mg/dL   Creat 0.96 0.60 - 1.26 mg/dL   Total Bilirubin 1.4 (H) 0.2 - 1.1 mg/dL   Alkaline Phosphatase 126 48 - 230 U/L   AST 17 12 - 32 U/L   ALT 10 8 - 46 U/L   Total Protein 6.9 6.3 - 8.2 g/dL   Albumin 4.2 3.6 - 5.1 g/dL   Calcium 9.7 8.9 - 10.4 mg/dL  CBC with Differential/Platelet  Result Value Ref Range   WBC 5.4 3.8 - 10.8 K/uL   RBC 5.98 (H) 4.20 - 5.80 MIL/uL   Hemoglobin 16.5 13.2 - 17.1 g/dL   HCT 48.8 38.5 - 50.0 %   MCV 81.6 80.0 - 100.0 fL   MCH 27.6 27.0 - 33.0 pg   MCHC 33.8 32.0 - 36.0 g/dL   RDW 13.5 11.0 - 15.0 %   Platelets 250 140 - 400 K/uL   MPV 10.2 7.5 - 12.5 fL   Neutro Abs 3,240 1,500 - 7,800 cells/uL   Lymphs Abs 1,620 850 - 3,900 cells/uL   Monocytes Absolute 378 200 - 950 cells/uL   Eosinophils Absolute 108 15 - 500 cells/uL   Basophils Absolute 54 0 - 200 cells/uL   Neutrophils Relative % 60 %   Lymphocytes Relative 30 %   Monocytes Relative 7 %   Eosinophils Relative 2 %   Basophils Relative 1 %   Smear Review Criteria for review not met   Lipid panel  Result Value Ref Range   Cholesterol 162 125 - 170  mg/dL   Triglycerides 53 38 - 152 mg/dL   HDL 59 31 - 65 mg/dL   Total CHOL/HDL Ratio 2.7 <=5.0 Ratio   VLDL 11 <30 mg/dL   LDL Cholesterol 92 <110 mg/dL  Microalbumin / creatinine, urine ratio  Result Value Ref Range   Creatinine,  Urine 247 20 - 370 mg/dL   Microalb, Ur 0.6 Not estab mg/dL   Microalb Creat Ratio 2 <30 mcg/mg creat  POCT Glucose (CBG)  Result Value Ref Range   POC Glucose 133 (A) 70 - 99 mg/dl  POCT HgB A1C  Result Value Ref Range   Hemoglobin A1C 10.6%       Assessment and Plan:   ASSESSMENT:  1. Type 1 diabetes on pump: Poor control. Koy struggled over the summer to take care of his diabetes in conjunction with working a new job. He has struggled getting back on track with checking his blood sugars and bolusing for his lunch. He is motivated to improve his care again.   2. Weight loss, unintentional: stable weight.   3. Maladaptive Behavior: Care has declined over the past three months, likely from a major change in his normal schedule. He appears motivated to get back on track now that he is back in college.  4. Essential Hypertension- Taking 97m of Lisinopril daily. He forgets occasionally.    PLAN:  1. Diagnostic: Labs as above.  2. Therapeutic: Continue Lisinopril 581mEVERYDAY  - No pump changes today. Need to be vigilant about bolusing when he eats and checking blood sugars appropriately.   - Check blood sugars at least 4 x per day   - Let the pump do the calculations, enter the right amount of carbs.  3. Patient education: Discussed challenges with diabetes care. Discussed importance of consistent care,even during life changes. Discussed starting Dexcom CGM again to help with glucose control. Discussed reminders to check blood sugar. Discussed bolusing prior to eating to help decrease blood sugar spikes. Encouraged IaBreyero contact me anytime he is having difficulties with his diabetes or needs pump adjustments.  4. Follow-up: 1 month   Level of Service: This visit lasted in excess of 40 minutes. More than 50% of the visit was devoted to counseling.  SpHermenia BersNP-C

## 2016-09-06 ENCOUNTER — Encounter: Payer: Self-pay | Admitting: *Deleted

## 2016-09-10 DIAGNOSIS — E1065 Type 1 diabetes mellitus with hyperglycemia: Secondary | ICD-10-CM | POA: Diagnosis not present

## 2016-10-09 ENCOUNTER — Ambulatory Visit (INDEPENDENT_AMBULATORY_CARE_PROVIDER_SITE_OTHER): Payer: BLUE CROSS/BLUE SHIELD | Admitting: Family

## 2016-10-09 ENCOUNTER — Encounter (INDEPENDENT_AMBULATORY_CARE_PROVIDER_SITE_OTHER): Payer: Self-pay | Admitting: Family

## 2016-10-09 VITALS — BP 145/78 | HR 87 | Wt 171.2 lb

## 2016-10-09 DIAGNOSIS — F54 Psychological and behavioral factors associated with disorders or diseases classified elsewhere: Secondary | ICD-10-CM

## 2016-10-09 DIAGNOSIS — IMO0001 Reserved for inherently not codable concepts without codable children: Secondary | ICD-10-CM

## 2016-10-09 DIAGNOSIS — I10 Essential (primary) hypertension: Secondary | ICD-10-CM

## 2016-10-09 DIAGNOSIS — E1065 Type 1 diabetes mellitus with hyperglycemia: Secondary | ICD-10-CM | POA: Diagnosis not present

## 2016-10-09 LAB — GLUCOSE, POCT (MANUAL RESULT ENTRY): POC Glucose: 176 mg/dl — AB (ref 70–99)

## 2016-10-09 NOTE — Patient Instructions (Signed)
-   Pump Changes   - Carb Ratio Change    - 12am: 15  - Sensitivity    - 80--> 50   - Work on rotating pump sites. Try abdomen, legs, arms   - Dont wear pump site more then 3 days  - - Check blood sugar at least 4 x per day  - Keep glucose with you at all times  - Make sure you are giving insulin with each meal and to correct for high blood sugars  - If you need anything, please do nt hesitate to contact me via MyChart or by calling the office.   [phone]

## 2016-10-09 NOTE — Progress Notes (Signed)
Pediatric Endocrinology Diabetes Consultation Follow-up Visit  [name] May 12, 1996 [id number]  Chief Complaint: Follow-up type 1 diabetes   PUDLO,RONALD J, MD   HPI: [name]  is a 20 y.o. male presenting for follow-up of type 1 diabetes.   1. Doroteo was admitted to the pediatric intensive care unit of [facility] on 09/22/2006 for evaluation and treatment of new-onset type 1 diabetes mellitus, diabetic ketoacidosis, dehydration, and weight loss. He was started on Lantus as a basal insulin and Novolog aspart as a bolus insulin at mealtimes, bedtime, and 2 AM. He received additional diabetes education through our Diabetes Survival Skills Program and the [facility]. He was subsequently converted to an [employer] insulin pump.  2. Since last visit to [facility] on 09/04/16, he has been well.  No ER visits or hospitalizations.   [name] is doing well in College to [employer], he like his current major. He has been going to the gym a lot recently and staying out late at night. He feels like the only time he is really running "high" is when he stays up late and snacks all night long. He reports that he gives insulin, but it does not seem to be enough insulin to keep his blood sugars stable. He also reports that he has experienced multiple failed sites recently, he is trying to rotate his sites better to prevent scar tissue. He acknowledges that he eats at the cafeteria frequently and eats pizza and pasta which are hard on his blood sugars. He is not wearing the Dexcom CGM that he has, he would prefer to check his blood sugars. He reports he takes Lisinopril "sometimes".   Insulin regimen: [employer] 670g insulin pump  Basal Rates 12AM 1.25  4am 1.70  8am 1.75  2pm 1.25  8pm 1.40     Insulin to Carbohydrate Ratio 12AM 20  5am 12  1030am 10  7pm 10        Insulin Sensitivity Factor 12AM 80  5am 40  1030am 30  7pm 35      Target Blood Glucose 12AM 150   6am 110  9pm 150           Hypoglycemia: Able to feel low blood sugars.  No glucagon needed recently.  Blood glucose download: Checking bg 2.7 times per day. Avg Bg 239. He has no days with 0 bg checks or missed boluses. His blood sugars are running higher over night, he reports this is because he stays up late and eats late. He is slightly basal heavy and 54%.  Med-alert ID: Not currently wearing. Injection sites: back and buttocks.  Annual labs due: 08/2017 Ophthalmology due: 2018. Discussed importance with patient today.     3. ROS: Greater than 10 systems reviewed with pertinent positives listed in HPI, otherwise neg. Constitutional: Reports he is feeling well. Has good appetite and energy.  Eyes: No changes in vision Ears/Nose/Mouth/Throat: No difficulty swallowing. Cardiovascular: No palpitations Respiratory: No increased work of breathing Gastrointestinal: No constipation or diarrhea. No abdominal pain Genitourinary: No nocturia, no polyuria Musculoskeletal: No joint pain Neurologic: Normal sensation, no tremor Endocrine: No polydipsia.  No hyperpigmentation Psychiatric: Normal affect  Past Medical History:   Past Medical History:  Diagnosis Date  . Diabetes mellitus   . Goiter   . Goiter   . Hypoglycemia associated with diabetes ([facility])   . Hypoglycemia associated with diabetes ([facility])   . Physical growth delay   . Thyroiditis, autoimmune  Medications:  Outpatient Encounter Prescriptions as of 10/09/2016  Medication Sig  . glucagon (GLUCAGON EMERGENCY) 1 MG injection Inject 1 mg intramuscularly in thigh one time for severe hypoglycemia (Patient taking differently: Inject 1 mg into the muscle once as needed (for severe hypoglycemia). )  . glucose blood (BAYER CONTOUR NEXT TEST) test strip Check blood sugar 6-8 times per day  . Lancets (ACCU-CHEK MULTICLIX) lancets Use to test blood sugars 10 x daily (Patient taking differently: 1 each by Other route See admin  instructions. Pt uses to test his blood sugars eight to ten times per day.)  . lisinopril (PRINIVIL,ZESTRIL) 5 MG tablet Take 1 tablet (5 mg total) by mouth daily.  Marland Kitchen NOVOLOG 100 UNIT/ML injection INJECT 300 UNITS IN INSULIN PUMP EVERY 48 HOURS   No facility-administered encounter medications on file as of 10/09/2016.     Allergies: No Known Allergies  Surgical History: Past Surgical History:  Procedure Laterality Date  . CIRCUMCISION      Family History:  Family History  Problem Relation Age of Onset  . Diabetes Paternal Grandmother   . Heart disease Paternal Grandmother   . Diabetes Paternal Grandfather   . Heart disease Paternal Grandfather   . Cancer Paternal Grandfather       Social History: Lives with: Roommate in college dorm at [employer].  Currently in Sophomore year of college.    Physical Exam:  Vitals:   10/09/16 1407  BP: (!) 145/78  Pulse: 87  Weight: 171 lb 3.2 oz (77.7 kg)   BP (!) 145/78   Pulse 87   Wt 171 lb 3.2 oz (77.7 kg)  Body mass index: body mass index is unknown because there is no height or weight on file. No height on file for this encounter.  Ht Readings from Last 3 Encounters:  07/12/15 5\' 9"  (1.753 m) (45 %, Z= -0.12)*  06/28/15 5\' 9"  (1.753 m) (45 %, Z= -0.12)*  06/13/15 5' 9.37" (1.762 m) (51 %, Z= 0.02)*   * Growth percentiles are based on CDC 2-20 Years data.   Wt Readings from Last 3 Encounters:  10/09/16 171 lb 3.2 oz (77.7 kg) (75 %, Z= 0.66)*  09/04/16 168 lb 3.2 oz (76.3 kg) (72 %, Z= 0.57)*  05/15/16 168 lb 11.2 oz (76.5 kg) (74 %, Z= 0.63)*   * Growth percentiles are based on CDC 2-20 Years data.    PHYSICAL EXAM:  Constitutional: The patient appears generally healthy. He is interactive and talkative today. He is more confident about his diabetes.  Head: The head is normocephalic. Face: The face appears normal. There are no obvious dysmorphic features. Eyes: There is no obvious arcus or proptosis. Moisture appears  low. Mouth: The oropharynx and tongue appear normal. Dentition appears to be normal for age. Oral moisture is decreased.  Neck: The neck appears to be visibly enlarged. The thyroid gland is normal size and consistency.The thyroid gland is non-tender today. Lungs: The lungs are clear to auscultation. Air movement is good Heart: Heart rate and rhythm are regular. Heart sounds S1 and S2 are normal. I did not appreciate any pathologic cardiac murmurs. Abdomen: The abdomen is normal in size for the patient's age. Bowel sounds are normal. There is no obvious hepatomegaly, splenomegaly, or other mass effect.  Arms: Muscle size and bulk are normal for age. Hands: There is no obvious tremor. Phalangeal and metacarpophalangeal joints are normal. Palmar muscles are normal for age. Palmar skin is normal. Palmar moisture is also normal. Legs: Muscles  appear normal for age. No edema is present. Feet: Feet are normally formed. His dorsalis pedal pulses are normal 2+. Neurologic: Strength is normal for age in both the upper and lower extremities. Muscle tone is normal. Sensation to touch is normal in both the legs and feet.    Labs: Last hemoglobin A1c:  Lab Results  Component Value Date   HGBA1C 10.6% 09/04/2016   Results for orders placed or performed in visit on 10/09/16  POCT Glucose (CBG)  Result Value Ref Range   POC Glucose 176 (A) 70 - 99 mg/dl    Assessment/Plan: [name] is a 20 y.o. male with type 1 diabetes in poor control. He is working to make improvements in his diabetes care. He has done better with checking his blood sugars and bolusing. He needs more insulin at night when he tends to eat more. He also needs to be more consistent taking Lisinopril daily.   1. DM w/o complication type I, uncontrolled (HCC) - Discussed bolusing prior to eating to prevent spikes  - Rotate pump sites to new areas  - keep glucose at all times  - POCT Glucose (CBG)  2. Essential hypertension Continue  Lisinopril daily  Discussed importance of medications for renal protection and blood pressure control.   3. Maladaptive health behaviors affecting medical condition Answered all questions  - Praise given for improvements that he has made  - Continue close follow up.   4. Insulin Pump Titration  - Carb Ratio Change    - 12am: 15  - Sensitivity    - 12am: 80--> 50      Follow-up:   1 month   Medical decision-making:  > 25 minutes spent, more than 50% of appointment was spent discussing diagnosis and management of symptoms  Gretchen ShortSpenser Kordelia Severin, FNP-C

## 2016-11-06 ENCOUNTER — Ambulatory Visit (INDEPENDENT_AMBULATORY_CARE_PROVIDER_SITE_OTHER): Payer: BLUE CROSS/BLUE SHIELD | Admitting: Family

## 2016-11-06 ENCOUNTER — Encounter (INDEPENDENT_AMBULATORY_CARE_PROVIDER_SITE_OTHER): Payer: Self-pay | Admitting: Family

## 2016-11-06 VITALS — BP 134/76 | HR 82 | Wt 170.0 lb

## 2016-11-06 DIAGNOSIS — E10649 Type 1 diabetes mellitus with hypoglycemia without coma: Secondary | ICD-10-CM | POA: Diagnosis not present

## 2016-11-06 DIAGNOSIS — I10 Essential (primary) hypertension: Secondary | ICD-10-CM | POA: Diagnosis not present

## 2016-11-06 DIAGNOSIS — F54 Psychological and behavioral factors associated with disorders or diseases classified elsewhere: Secondary | ICD-10-CM | POA: Diagnosis not present

## 2016-11-06 DIAGNOSIS — E1065 Type 1 diabetes mellitus with hyperglycemia: Secondary | ICD-10-CM

## 2016-11-06 DIAGNOSIS — IMO0001 Reserved for inherently not codable concepts without codable children: Secondary | ICD-10-CM

## 2016-11-06 LAB — GLUCOSE, POCT (MANUAL RESULT ENTRY): POC Glucose: 249 mg/dl — AB (ref 70–99)

## 2016-11-06 MED ORDER — ACCU-CHEK MULTICLIX LANCETS MISC: 6 refills | Status: AC

## 2016-11-06 NOTE — Progress Notes (Signed)
Pediatric Endocrinology Diabetes Consultation Follow-up Visit  [name] 01-17-1996 [id number]  Chief Complaint: Follow-up type 1 diabetes   PUDLO,RONALD J, MD   HPI: [name]  is a 20 y.o. male presenting for follow-up of type 1 diabetes.   1. [name] was admitted to the pediatric intensive care unit of [facility] on 09/22/2006 for evaluation and treatment of new-onset type 1 diabetes mellitus, diabetic ketoacidosis, dehydration, and weight loss. He was started on Lantus as a basal insulin and Novolog aspart as a bolus insulin at mealtimes, bedtime, and 2 AM. He received additional diabetes education through our Diabetes Survival Skills Program and the [facility]. He was subsequently converted to an [employer] insulin pump.  2. Since last visit to [facility] on 10/09/16, he has been well.  No ER visits or hospitalizations.   [name] continues to struggle with high blood sugars at night. He reports that he eats after midnight frequently and his blood sugars will not come down when he eats. He has tried adding extra insulin at night and sometimes that will help bring him down. He admits that he is still not checking his blood sugars frequently enough. He is rotating his insulin pump sites more frequently. He has not been taking Lisinopril over the past 2 weeks.    Insulin regimen: Medtronic 530g insulin pump  Basal Rates 12AM 1.25  4am 1.70  8am 1.75  2pm 1.25  8pm 1.40     Insulin to Carbohydrate Ratio 12AM 20  5am 12  1030am 10  7pm 10        Insulin Sensitivity Factor 12AM 80  5am 40  1030am 30  7pm 35      Target Blood Glucose 12AM 150  6am 110  9pm 150           Hypoglycemia: Able to feel low blood sugars.  No glucagon needed recently.  Blood glucose download: Avg BG: 226 Checking an avg of 2.6 times per day 60% basal, 40% bolus Med-alert ID: Not currently wearing. Injection sites: back and buttocks.  Annual labs due:  08/2017 Ophthalmology due: 2018. Discussed importance with patient today.     3. ROS: Greater than 10 systems reviewed with pertinent positives listed in HPI, otherwise neg. Constitutional: Reports he is feeling good. Has good appetite and energy.  Eyes: No changes in vision Ears/Nose/Mouth/Throat: No difficulty swallowing. Cardiovascular: No palpitations Respiratory: No increased work of breathing Gastrointestinal: No constipation or diarrhea. No abdominal pain Genitourinary: No nocturia, no polyuria Musculoskeletal: No joint pain Neurologic: Normal sensation, no tremor Endocrine: No polydipsia.  No hyperpigmentation Psychiatric: Normal affect  Past Medical History:   Past Medical History:  Diagnosis Date  . Diabetes mellitus   . Goiter   . Goiter   . Hypoglycemia associated with diabetes (HCC)   . Hypoglycemia associated with diabetes (HCC)   . Physical growth delay   . Thyroiditis, autoimmune     Medications:  Outpatient Encounter Prescriptions as of 11/06/2016  Medication Sig  . glucagon (GLUCAGON EMERGENCY) 1 MG injection Inject 1 mg intramuscularly in thigh one time for severe hypoglycemia (Patient taking differently: Inject 1 mg into the muscle once as needed (for severe hypoglycemia). )  . glucose blood (BAYER CONTOUR NEXT TEST) test strip Check blood sugar 6-8 times per day  . Lancets (ACCU-CHEK MULTICLIX) lancets Use to test blood sugars 10 x daily  . lisinopril (PRINIVIL,ZESTRIL) 5 MG tablet Take 1 tablet (5 mg total) by mouth daily.  Marland Kitchen  NOVOLOG 100 UNIT/ML injection INJECT 300 UNITS IN INSULIN PUMP EVERY 48 HOURS  . [DISCONTINUED] Lancets (ACCU-CHEK MULTICLIX) lancets Use to test blood sugars 10 x daily (Patient taking differently: 1 each by Other route See admin instructions. Pt uses to test his blood sugars eight to ten times per day.)   No facility-administered encounter medications on file as of 11/06/2016.     Allergies: No Known Allergies  Surgical  History: Past Surgical History:  Procedure Laterality Date  . CIRCUMCISION      Family History:  Family History  Problem Relation Age of Onset  . Diabetes Paternal Grandmother   . Heart disease Paternal Grandmother   . Diabetes Paternal Grandfather   . Heart disease Paternal Grandfather   . Cancer Paternal Grandfather       Social History: Lives with: Roommate in [place] [facility] [employer].  Currently in Sophomore year of college.    Physical Exam:  Vitals:   11/06/16 1419  BP: 134/76  Pulse: 82  Weight: 170 lb (77.1 kg)   BP 134/76   Pulse 82   Wt 170 lb (77.1 kg)  Body mass index: body mass index is unknown because there is no height or weight on file. No height on file for this encounter.  Ht Readings from Last 3 Encounters:  07/12/15 5\' 9"  (1.753 m) (45 %, Z= -0.12)*  06/28/15 5\' 9"  (1.753 m) (45 %, Z= -0.12)*  06/13/15 5' 9.37" (1.762 m) (51 %, Z= 0.02)*   * Growth percentiles are based on CDC 2-20 Years data.   Wt Readings from Last 3 Encounters:  11/06/16 170 lb (77.1 kg) (73 %, Z= 0.61)*  10/09/16 171 lb 3.2 oz (77.7 kg) (75 %, Z= 0.66)*  09/04/16 168 lb 3.2 oz (76.3 kg) (72 %, Z= 0.57)*   * Growth percentiles are based on CDC 2-20 Years data.    PHYSICAL EXAM:  Constitutional: The patient appears generally healthy. He is interactive and talkative today.   Head: The head is normocephalic. Face: The face appears normal. There are no obvious dysmorphic features. Eyes: There is no obvious arcus or proptosis. Moisture appears low. Mouth: The oropharynx and tongue appear normal. Dentition appears to be normal for age. Oral moisture is decreased.  Neck: The neck appears to be visibly enlarged. The thyroid gland is normal size and consistency.The thyroid gland is non-tender today. Lungs: The lungs are clear to auscultation. Air movement is good Heart: Heart rate and rhythm are regular. Heart sounds S1 and S2 are normal. I did not appreciate any pathologic cardiac  murmurs. Abdomen: The abdomen is normal in size for the patient's age. Bowel sounds are normal. There is no obvious hepatomegaly, splenomegaly, or other mass effect.  Arms: Muscle size and bulk are normal for age. Hands: There is no obvious tremor. Phalangeal and metacarpophalangeal joints are normal. Palmar muscles are normal for age. Palmar skin is normal. Palmar moisture is also normal. Legs: Muscles appear normal for age. No edema is present. Feet: Feet are normally formed. His dorsalis pedal pulses are normal 2+. Neurologic: Strength is normal for age in both the upper and lower extremities. Muscle tone is normal. Sensation to touch is normal in both the legs and feet.    Labs: Last hemoglobin A1c:  Lab Results  Component Value Date   HGBA1C 10.6% 09/04/2016   Results for orders placed or performed in visit on 11/06/16  POCT Glucose (CBG)  Result Value Ref Range   POC Glucose 249 (A)  70 - 99 mg/dl    Assessment/Plan: [name] is a 20 y.o. male with type 1 diabetes in poor control. He continues to struggle to consistently check his blood sugar. He has been working harder to bolus appropriately but needs more insulin late at night since that is when he tends to eat.   1. DM w/o complication type I, uncontrolled (HCC) - Discussed bolusing prior to eating to prevent spikes  - Rotate pump sites to new areas  - keep glucose at all times  - POCT Glucose (CBG)  2. Essential hypertension Continue Lisinopril daily  Discussed importance of medications for renal protection and blood pressure control.  Discussed possible adverse effects of medication.   3. Maladaptive health behaviors affecting medical condition Answered all questions  - Continue close follow up.   4. Insulin Pump Titration   nsulin to Carbohydrate Ratio 12AM 20--> 12  5am 12  1030am 10  7pm 10 --> 8       Insulin Sensitivity Factor 12AM 80--> 40   5am 40  1030am 30  7pm 35         Follow-up:   1 month    Medical decision-making:  > 25 minutes spent, more than 50% of appointment was spent discussing diagnosis and management of symptoms  Gretchen ShortSpenser Ladarrius Bogdanski, FNP-C

## 2016-11-06 NOTE — Patient Instructions (Signed)
Carb Ratio changes  12am: 20--> 12 7pm: 10--> 8   Sensitivity Changes  12am: 80--> 40   - At least 4 checks per day   - Check blood sugar at least 4 x per day  - Keep glucose with you at all times  - Make sure you are giving insulin with each meal and to correct for high blood sugars  - If you need anything, please do nt hesitate to contact me via MyChart or by calling the office.   [phone]

## 2016-12-11 ENCOUNTER — Ambulatory Visit (INDEPENDENT_AMBULATORY_CARE_PROVIDER_SITE_OTHER): Payer: BLUE CROSS/BLUE SHIELD | Admitting: Family

## 2016-12-11 ENCOUNTER — Encounter (INDEPENDENT_AMBULATORY_CARE_PROVIDER_SITE_OTHER): Payer: Self-pay | Admitting: Family

## 2016-12-11 VITALS — BP 118/82 | HR 76 | Ht 69.06 in | Wt 164.2 lb

## 2016-12-11 DIAGNOSIS — F54 Psychological and behavioral factors associated with disorders or diseases classified elsewhere: Secondary | ICD-10-CM | POA: Diagnosis not present

## 2016-12-11 DIAGNOSIS — E1065 Type 1 diabetes mellitus with hyperglycemia: Secondary | ICD-10-CM

## 2016-12-11 DIAGNOSIS — IMO0001 Reserved for inherently not codable concepts without codable children: Secondary | ICD-10-CM

## 2016-12-11 DIAGNOSIS — I10 Essential (primary) hypertension: Secondary | ICD-10-CM

## 2016-12-11 LAB — POCT GLYCOSYLATED HEMOGLOBIN (HGB A1C): Hemoglobin A1C: 11.6

## 2016-12-11 LAB — GLUCOSE, POCT (MANUAL RESULT ENTRY): POC Glucose: 135 mg/dl — AB (ref 70–99)

## 2016-12-11 MED ORDER — LISINOPRIL 5 MG PO TABS: 5.0000 mg | ORAL_TABLET | Freq: Every day | ORAL | 11 refills | Status: DC

## 2016-12-11 NOTE — Progress Notes (Signed)
Pediatric Endocrinology Diabetes Consultation Follow-up Visit  Thurston Holean Risk 08/25/1996 [id number]  Chief Complaint: Follow-up type 1 diabetes   PUDLO,RONALD J, MD   HPI: [name]  is a 20 y.o. male presenting for follow-up of type 1 diabetes.   1. [name] was admitted to the pediatric [facility] on 09/22/[facility] for evaluation and treatment of new-onset type 1 diabetes mellitus, diabetic ketoacidosis, dehydration, and weight loss. He was started on Lantus as a basal insulin and Novolog aspart as a bolus insulin at mealtimes, bedtime, and 2 AM. He received additional diabetes education through our Diabetes Survival Skills Program and the [facility]. He was subsequently converted to an [employer] insulin pump.  2. Since last visit to [facility] on 11/06/16, he has been well.  No ER visits or hospitalizations.   [name] feels like he is doing better since his last visit, he feels like the adjustments that were made have helped his blood sugars. He had his wisdom teeth removed 3 days ago but his blood sugars were fine during that process. He has been home on Christmas break for 2 weeks now and has noticed that he checks more often and gives insulin better when he is at home. He acknowledges that his blood sugars are still running high at night because he likes to eat late and usually eats at least 100 grams of carbs at each meal. He also acknowledges that he misses boluses when he snacks quite often.   He has been taking 5mg  of Lisinopril almost every day. He estimates he misses 1-2 doses per week.     Insulin regimen: Medtronic 530g insulin pump  Basal Rates 12AM 1.25  4am 1.70  8am 1.75  2pm 1.25  8pm 1.40     Insulin to Carbohydrate Ratio 12AM 12  5am 12  1030am 10  4pm 10   7pm 8    Insulin Sensitivity Factor 12AM 45  5am 40  1030am 30  7pm 30      Target Blood Glucose 12AM 150  6am 110  9pm 150            Hypoglycemia: Able to feel low blood sugars.  No glucagon needed recently.  Blood glucose download: Avg BG: 228 Checking an avg of 2.6 times per day. He has 3 days with 0 checks.  59% basal, 41% bolus Med-alert ID: Not currently wearing. Injection sites: back and buttocks.  Annual labs due: 08/2017 Ophthalmology due: 2018. Discussed importance with patient today.     3. ROS: Greater than 10 systems reviewed with pertinent positives listed in HPI, otherwise neg. Constitutional: Reports he is feeling good. Has good appetite and energy.  Eyes: No changes in vision Ears/Nose/Mouth/Throat: No difficulty swallowing. Cardiovascular: No palpitations Respiratory: No increased work of breathing Gastrointestinal: No constipation or diarrhea. No abdominal pain Genitourinary: No nocturia, no polyuria Musculoskeletal: No joint pain Neurologic: Normal sensation, no tremor Endocrine: No polydipsia.  No hyperpigmentation Psychiatric: Normal affect  Past Medical History:   Past Medical History:  Diagnosis Date  . Diabetes mellitus   . Goiter   . Goiter   . Hypoglycemia associated with diabetes (HCC)   . Hypoglycemia associated with diabetes (HCC)   . Physical growth delay   . Thyroiditis, autoimmune     Medications:  Outpatient Encounter Prescriptions as of 12/11/2016  Medication Sig  . glucagon (GLUCAGON EMERGENCY) 1 MG injection Inject 1 mg intramuscularly in thigh one time for severe hypoglycemia (  Patient taking differently: Inject 1 mg into the muscle once as needed (for severe hypoglycemia). )  . glucose blood (BAYER CONTOUR NEXT TEST) test strip Check blood sugar 6-8 times per day  . Lancets (ACCU-CHEK MULTICLIX) lancets Use to test blood sugars 10 x daily  . lisinopril (PRINIVIL,ZESTRIL) 5 MG tablet Take 1 tablet (5 mg total) by mouth daily.  Marland Kitchen NOVOLOG 100 UNIT/ML injection INJECT 300 UNITS IN INSULIN PUMP EVERY 48 HOURS  . [DISCONTINUED] lisinopril (PRINIVIL,ZESTRIL) 5 MG  tablet Take 1 tablet (5 mg total) by mouth daily.   No facility-administered encounter medications on file as of 12/11/2016.     Allergies: No Known Allergies  Surgical History: Past Surgical History:  Procedure Laterality Date  . CIRCUMCISION      Family History:  Family History  Problem Relation Age of Onset  . Diabetes Paternal Grandmother   . Heart disease Paternal Grandmother   . Diabetes Paternal Grandfather   . Heart disease Paternal Grandfather   . Cancer Paternal Grandfather       Social History: Lives with: Roommate in college dorm at [employer].  Currently in Sophomore year of college.    Physical Exam:  Vitals:   12/11/16 1425  BP: 118/82  Pulse: 76  Weight: 164 lb 3.2 oz (74.5 kg)  Height: 5' 9.05" (1.754 m)   BP 118/82   Pulse 76   Ht 5' 9.05" (1.754 m)   Wt 164 lb 3.2 oz (74.5 kg)   BMI 24.21 kg/m  Body mass index: body mass index is 24.21 kg/m. Blood pressure percentiles are 37 % systolic and 70 % diastolic based on NHBPEP's 4th Report. Blood pressure percentile targets: 90: 135/91, 95: 139/95, 99 + 5 mmHg: 152/108.  Ht Readings from Last 3 Encounters:  12/11/16 5' 9.05" (1.754 m) (43 %, Z= -0.18)*  07/12/15 5\' 9"  (1.753 m) (45 %, Z= -0.12)*  06/28/15 5\' 9"  (1.753 m) (45 %, Z= -0.12)*   * Growth percentiles are based on CDC 2-20 Years data.   Wt Readings from Last 3 Encounters:  12/11/16 164 lb 3.2 oz (74.5 kg) (66 %, Z= 0.40)*  11/06/16 170 lb (77.1 kg) (73 %, Z= 0.61)*  10/09/16 171 lb 3.2 oz (77.7 kg) (75 %, Z= 0.66)*   * Growth percentiles are based on CDC 2-20 Years data.    PHYSICAL EXAM:  General: Well developed, well nourished male in no acute distress.  He is not as talkative today but will answer questions.  Head: Normocephalic, atraumatic.   Eyes:  Pupils equal and round. EOMI.  Sclera white.  No eye drainage.   Ears/Nose/Mouth/Throat: Nares patent, no nasal drainage.  Normal dentition, mucous membranes moist.  Oropharynx  intact. Neck: supple, no cervical lymphadenopathy, no thyromegaly Cardiovascular: regular rate, normal S1/S2, no murmurs Respiratory: No increased work of breathing.  Lungs clear to auscultation bilaterally.  No wheezes. Abdomen: soft, nontender, nondistended. Normal bowel sounds.  No appreciable masses  Extremities: warm, well perfused, cap refill < 2 sec.   Musculoskeletal: Normal muscle mass.  Normal strength Skin: warm, dry.  No rash or lesions. Neurologic: alert and oriented, normal speech and gait   Labs: Last hemoglobin A1c:  Lab Results  Component Value Date   HGBA1C 11.6 12/11/2016   Results for orders placed or performed in visit on 12/11/16  POCT Glucose (CBG)  Result Value Ref Range   POC Glucose 135 (A) 70 - 99 mg/dl  POCT HgB B1Y  Result Value Ref Range  Hemoglobin A1C 11.6     Assessment/Plan: [name] is a 20 y.o. male with type 1 diabetes in poor control. Leul's A1c has worsened since his last visit. He continues to miss insulin doses when he snack during the day. He is eating very high carb meals, late at night which contribute to night time hyperglycemia. He has only been changing his pump site once ever 4-6 days.   1. DM w/o complication type I, uncontrolled (HCC) - Discussed bolusing prior to eating to prevent spikes  - Rotate pump sites to new areas  - Change pump site every 3 days  - keep glucose at all times  - POCT Glucose (CBG) - Check blood sugar at least 4 x per day  - start back on CGM  - Reviewed blood sugar download with patient.   2. Essential hypertension Continue Lisinopril 5mg  daily  Discussed importance of medications for renal protection and blood pressure control.  Discussed possible adverse effects of medication.   3. Maladaptive health behaviors affecting medical condition Answered all questions  - Continue close follow up.        Follow-up:   1 month   Medical decision-making:  > 25 minutes spent, more than 50% of appointment  was spent discussing diagnosis and management of symptoms  Gretchen ShortSpenser Ibrohim Simmers, FNP-C

## 2016-12-11 NOTE — Patient Instructions (Signed)
-   Make sure you bolus every single time you eat  - Try to bolus before you eat  - Change your pump site every 3 days  - Put your Dexcom back on  - - Check blood sugar at least 4 x per day  - Keep glucose with you at all times  - Make sure you are giving insulin with each meal and to correct for high blood sugars  - If you need anything, please do nt hesitate to contact me via MyChart or by calling the office.   [phone]  - Follow up in one month

## 2017-01-06 DIAGNOSIS — E1065 Type 1 diabetes mellitus with hyperglycemia: Secondary | ICD-10-CM | POA: Diagnosis not present

## 2017-01-16 ENCOUNTER — Encounter (INDEPENDENT_AMBULATORY_CARE_PROVIDER_SITE_OTHER): Payer: Self-pay | Admitting: Family

## 2017-01-16 ENCOUNTER — Ambulatory Visit (INDEPENDENT_AMBULATORY_CARE_PROVIDER_SITE_OTHER): Payer: BLUE CROSS/BLUE SHIELD | Admitting: Family

## 2017-01-16 VITALS — BP 140/78 | HR 88 | Ht 69.09 in | Wt 171.8 lb

## 2017-01-16 DIAGNOSIS — I10 Essential (primary) hypertension: Secondary | ICD-10-CM

## 2017-01-16 DIAGNOSIS — F54 Psychological and behavioral factors associated with disorders or diseases classified elsewhere: Secondary | ICD-10-CM

## 2017-01-16 DIAGNOSIS — IMO0001 Reserved for inherently not codable concepts without codable children: Secondary | ICD-10-CM

## 2017-01-16 DIAGNOSIS — E1065 Type 1 diabetes mellitus with hyperglycemia: Secondary | ICD-10-CM | POA: Diagnosis not present

## 2017-01-16 LAB — GLUCOSE, POCT (MANUAL RESULT ENTRY): POC Glucose: 243 mg/dl — AB (ref 70–99)

## 2017-01-16 MED ORDER — LISINOPRIL 10 MG PO TABS: 10.0000 mg | ORAL_TABLET | Freq: Every day | ORAL | 3 refills | Status: AC

## 2017-01-16 NOTE — Patient Instructions (Signed)
-   Increase Lisinopril to 10 mg  - Start eating healthy foods at cafeteria  - No pump changes   - your goal is to give insulin every time you eat   - Should be 4-5 boluses per day  - Continue checking blood sugars, at least 4 x per day  - Start on Dexcom CGM

## 2017-01-17 ENCOUNTER — Encounter (INDEPENDENT_AMBULATORY_CARE_PROVIDER_SITE_OTHER): Payer: Self-pay | Admitting: Family

## 2017-01-17 NOTE — Progress Notes (Signed)
Pediatric Endocrinology Diabetes Consultation Follow-up Visit  [name] 09/18/1996 [id number]  Chief Complaint: Follow-up type 1 diabetes   PUDLO,RONALD J, MD   HPI: [name]  is a 21 y.o. male presenting for follow-up of type 1 diabetes.   1. [name] was admitted to the pediatric intensive care unit of [facility] on 09/22/2006 for evaluation and treatment of new-onset type 1 diabetes mellitus, diabetic ketoacidosis, dehydration, and weight loss. He was started on Lantus as a basal insulin and Novolog aspart as a bolus insulin at mealtimes, bedtime, and 2 AM. He received additional diabetes education through our Diabetes Survival Skills Program and the [facility]. He was subsequently converted to an [employer] insulin pump.  2. Since last visit to [facility] on 12/17, he has been well.  No ER visits or hospitalizations.   [name] is happy to be back at school and back in the dorms. He reports that his schedule is getting more normal now and he is having an easier time managing his diabetes. He states that he usually has good blood sugars during the day but that he eats late at night and then his blood sugars stay high all night. He confirms that he frequently forgets to bolus when he is eating late at night. He is checking his blood sugar more and trying to do better bolusing. He is changing his pump site ever 4-5 days. He just ordered a new Dexcom CGM so that he can get alarms when he is high at night. He is also due for a new insulin pump and would like to try the Medtronic 670g.   [name] reports that he has been taking Lisinopril almost every day. He is concerned today because his blood pressure is still high. He eats at the cafeteria frequently and tends to eat fried chicken, burgers, fries. He also reports that he is feeling very stressed right now due to problems at his job and having a lot of work at school. He has a strong family history of hypertension as  well.    Insulin regimen: Medtronic 530g insulin pump  Basal Rates 12AM 1.25  4am 1.70  8am 1.75  2pm 1.25  8pm 1.40     Insulin to Carbohydrate Ratio 12AM 12  5am 12  1030am 10  4pm 10   7pm 8    Insulin Sensitivity Factor 12AM 45  5am 40  1030am 30  7pm 30      Target Blood Glucose 12AM 150  6am 110  9pm 150           Hypoglycemia: Able to feel low blood sugars.  No glucagon needed recently.  Blood glucose download: Checking Bg 3.1 times per day. Avg Bg 214  - He does not have any days with missed blood sugar checks   - He is bolusing 2.5 times per day   - He is Above range 49% and below range 7%.   - Pattern of high blood sugars starting around 10 pm and lasting until morning.  Med-alert ID: Not currently wearing. Injection sites: back and buttocks.  Annual labs due: 08/2017 Ophthalmology due: 2018. Discussed importance with patient today.     3. ROS: Greater than 10 systems reviewed with pertinent positives listed in HPI, otherwise neg. Constitutional: Reports he is feeling good. Has good appetite and energy.  Eyes: No changes in vision. He is due for eye exam. Cardiovascular: No palpitations. Respiratory: No increased work of breathing.  Gastrointestinal:  No constipation or diarrhea. No abdominal pain Genitourinary: No nocturia, no polyuria Neurologic: Normal sensation, no tremor Endocrine: No polydipsia.  No hyperpigmentation Psychiatric: Normal affect  Past Medical History:   Past Medical History:  Diagnosis Date  . Diabetes mellitus   . Goiter   . Goiter   . Hypoglycemia associated with diabetes (HCC)   . Hypoglycemia associated with diabetes (HCC)   . Physical growth delay   . Thyroiditis, autoimmune     Medications:  Outpatient Encounter Prescriptions as of 01/16/2017  Medication Sig  . glucagon (GLUCAGON EMERGENCY) 1 MG injection Inject 1 mg intramuscularly in thigh one time for severe hypoglycemia (Patient taking differently: Inject  1 mg into the muscle once as needed (for severe hypoglycemia). )  . glucose blood (BAYER CONTOUR NEXT TEST) test strip Check blood sugar 6-8 times per day  . Lancets (ACCU-CHEK MULTICLIX) lancets Use to test blood sugars 10 x daily  . NOVOLOG 100 UNIT/ML injection INJECT 300 UNITS IN INSULIN PUMP EVERY 48 HOURS  . [DISCONTINUED] lisinopril (PRINIVIL,ZESTRIL) 5 MG tablet Take 1 tablet (5 mg total) by mouth daily.  Marland Kitchen lisinopril (PRINIVIL,ZESTRIL) 10 MG tablet Take 1 tablet (10 mg total) by mouth daily.   No facility-administered encounter medications on file as of 01/16/2017.     Allergies: No Known Allergies  Surgical History: Past Surgical History:  Procedure Laterality Date  . CIRCUMCISION      Family History:  Family History  Problem Relation Age of Onset  . Diabetes Paternal Grandmother   . Heart disease Paternal Grandmother   . Diabetes Paternal Grandfather   . Heart disease Paternal Grandfather   . Cancer Paternal Grandfather       Social History: Lives with: Roommate in college dorm at [place].  Currently in Sophomore year of college.    Physical Exam:  Vitals:   01/16/17 1600  BP: 140/78  Pulse: 88  Weight: 171 lb 12.8 oz (77.9 kg)  Height: 5' 9.09" (1.755 m)   BP 140/78   Pulse 88   Ht 5' 9.09" (1.755 m)   Wt 171 lb 12.8 oz (77.9 kg)   BMI 25.30 kg/m  Body mass index: body mass index is 25.3 kg/m. Blood pressure percentiles are 96 % systolic and 55 % diastolic based on NHBPEP's 4th Report. Blood pressure percentile targets: 90: 135/91, 95: 139/96, 99 + 5 mmHg: 152/109.  Ht Readings from Last 3 Encounters:  01/16/17 5' 9.09" (1.755 m) (43 %, Z= -0.17)*  12/11/16 5' 9.05" (1.754 m) (43 %, Z= -0.18)*  07/12/15 5\' 9"  (1.753 m) (45 %, Z= -0.12)*   * Growth percentiles are based on CDC 2-20 Years data.   Wt Readings from Last 3 Encounters:  01/16/17 171 lb 12.8 oz (77.9 kg) (74 %, Z= 0.65)*  12/11/16 164 lb 3.2 oz (74.5 kg) (66 %, Z= 0.40)*  11/06/16 170 lb  (77.1 kg) (73 %, Z= 0.61)*   * Growth percentiles are based on CDC 2-20 Years data.    PHYSICAL EXAM:  General: Well developed, well nourished male in no acute distress. He is interactive today with lots of good questions about his health.  Head: Normocephalic, atraumatic.   Eyes:  Pupils equal and round. EOMI.  Sclera white.  No eye drainage.   Ears/Nose/Mouth/Throat: Nares patent, no nasal drainage.  Normal dentition, mucous membranes moist.  Oropharynx intact. Neck: supple, no cervical lymphadenopathy, no thyromegaly Cardiovascular: regular rate, normal S1/S2, no murmurs Respiratory: No increased work of breathing.  Lungs clear  to auscultation bilaterally.  No wheezes. Abdomen: soft, nontender, nondistended. Normal bowel sounds.  No appreciable masses  Extremities: warm, well perfused, cap refill < 2 sec.   Musculoskeletal: Normal muscle mass.  Normal strength Skin: warm, dry.  No rash or lesions. Neurologic: alert and oriented, normal speech and gait   Labs: Last hemoglobin A1c:  Lab Results  Component Value Date   HGBA1C 11.6 12/11/2016   Results for orders placed or performed in visit on 01/16/17  POCT Glucose (CBG)  Result Value Ref Range   POC Glucose 243 (A) 70 - 99 mg/dl    Assessment/Plan: [name] is a 21 y.o. male with type 1 diabetes in poor control. Lucy has started to make some improvements in his diabetes care. He is checking blood sugar more often and is not going days without checks. He is having long period of hyperglycemia at night because he is not bolusing for high carb meals before going to bed. His blood pressure is also higher today which appears to be a combination of poor diabetes control, poor diet and stress.   1. DM w/o complication type I, uncontrolled (HCC) - Discussed bolusing prior to eating to prevent spikes  - Change pump site every 3 days  - keep glucose at all times  - POCT Glucose (CBG) - Check blood sugar at least 4 x per day  - Should  bolus 4-5 times per day. Remember to bolus at night  - start back on CGM  - Reviewed blood sugar download with patient.   2. Essential hypertension Increase Lisinopril to 10mg  per day  Discussed importance of medications for renal protection and blood pressure control.  Discussed possible adverse effects of medication.  Discussed healthy diet and trying to make healthier choices when eating at cafeteria. Also stressed importance of exercise.   3. Maladaptive health behaviors affecting medical condition Answered all questions  - Continue close follow up.  - Discussed medtronic 670g and possible challenges Merrick could have along with the possible good outcomes.        Follow-up:   1 month   Medical decision-making:  > 25 minutes spent, more than 50% of appointment was spent discussing diagnosis and management of symptoms  Gretchen Short, FNP-C

## 2017-02-13 ENCOUNTER — Ambulatory Visit (INDEPENDENT_AMBULATORY_CARE_PROVIDER_SITE_OTHER): Payer: BLUE CROSS/BLUE SHIELD | Admitting: Family

## 2017-02-13 ENCOUNTER — Encounter (INDEPENDENT_AMBULATORY_CARE_PROVIDER_SITE_OTHER): Payer: Self-pay | Admitting: Family

## 2017-02-13 VITALS — BP 122/70 | HR 88 | Ht 68.78 in | Wt 172.4 lb

## 2017-02-13 DIAGNOSIS — I10 Essential (primary) hypertension: Secondary | ICD-10-CM

## 2017-02-13 DIAGNOSIS — E1065 Type 1 diabetes mellitus with hyperglycemia: Secondary | ICD-10-CM

## 2017-02-13 DIAGNOSIS — F54 Psychological and behavioral factors associated with disorders or diseases classified elsewhere: Secondary | ICD-10-CM

## 2017-02-13 DIAGNOSIS — IMO0001 Reserved for inherently not codable concepts without codable children: Secondary | ICD-10-CM

## 2017-02-13 LAB — GLUCOSE, POCT (MANUAL RESULT ENTRY): POC Glucose: 282 mg/dl — AB (ref 70–99)

## 2017-02-13 NOTE — Progress Notes (Signed)
Pediatric Endocrinology Diabetes Consultation Follow-up Visit  [name] Apr 25, 1996 [id number]  Chief Complaint: Follow-up type 1 diabetes   PUDLO,RONALD J, MD   HPI: [name]  is a 21 y.o. male presenting for follow-up of type 1 diabetes.   1. [name] was admitted to the pediatric intensive care unit of [facility] on 09/22/2006 for evaluation and treatment of new-onset type 1 diabetes mellitus, diabetic ketoacidosis, dehydration, and weight loss. He was started on Lantus as a basal insulin and Novolog aspart as a bolus insulin at mealtimes, bedtime, and 2 AM. He received additional diabetes education through our Diabetes Survival Skills Program and the [facility]. He was subsequently converted to an [employer] insulin pump.  2. Since last visit to [facility] on 12/17, he has been well.  No ER visits or hospitalizations.   Hilliard feels like he has improved his diabetes care since his last visit. He has been trying hard to bolus more often and check his blood sugar consistently. He has been wearing his Dexcom CGM and is finding that it is very helpful for him. He is having more lows after breakfast recentlyl. He is excited about attending the [employer] this weekend to help with fund raising.    Insulin regimen: Medtronic 530g insulin pump  Basal Rates 12AM 1.25  4am 1.70  8am 1.75  2pm 1.25  8pm 1.40     Insulin to Carbohydrate Ratio 12AM 12  5am 12  1030am 10  4pm 10   7pm 8    Insulin Sensitivity Factor 12AM 45  5am 40  1030am 30  7pm 30      Target Blood Glucose 12AM 150  6am 110  9pm 150           Hypoglycemia: Able to feel low blood sugars.  No glucagon needed recently.  Blood glucose download: Checking Bg 2.2 times per day. Avg Bg 205  - He is bolusing 3.2 times per day   - He is Above range 58% and below range 18%.   - Pattern of low blood sugars between 8am-2pm  - Pattern of high blood sugars from 10pm-4am.  Med-alert  ID: Not currently wearing. Injection sites: back and buttocks.  Annual labs due: 08/2017 Ophthalmology due: 2018. Discussed importance with patient today.     3. ROS: Greater than 10 systems reviewed with pertinent positives listed in HPI, otherwise neg. Constitutional: Reports he is feeling good. Has good appetite and energy.  Eyes: No changes in vision. He is due for eye exam. Cardiovascular: No palpitations. Respiratory: No increased work of breathing.  Gastrointestinal: No constipation or diarrhea. No abdominal pain Genitourinary: No nocturia, no polyuria Neurologic: Normal sensation, no tremor Endocrine: No polydipsia.  No hyperpigmentation Psychiatric: Normal affect  Past Medical History:   Past Medical History:  Diagnosis Date  . Diabetes mellitus   . Goiter   . Goiter   . Hypoglycemia associated with diabetes (HCC)   . Hypoglycemia associated with diabetes (HCC)   . Physical growth delay   . Thyroiditis, autoimmune     Medications:  Outpatient Encounter Prescriptions as of 02/13/2017  Medication Sig  . glucagon (GLUCAGON EMERGENCY) 1 MG injection Inject 1 mg intramuscularly in thigh one time for severe hypoglycemia (Patient taking differently: Inject 1 mg into the muscle once as needed (for severe hypoglycemia). )  . glucose blood (BAYER CONTOUR NEXT TEST) test strip Check blood sugar 6-8 times per day  . Lancets (ACCU-CHEK MULTICLIX) lancets  Use to test blood sugars 10 x daily  . lisinopril (PRINIVIL,ZESTRIL) 10 MG tablet Take 1 tablet (10 mg total) by mouth daily.  Marland Kitchen. NOVOLOG 100 UNIT/ML injection INJECT 300 UNITS IN INSULIN PUMP EVERY 48 HOURS   No facility-administered encounter medications on file as of 02/13/2017.     Allergies: No Known Allergies  Surgical History: Past Surgical History:  Procedure Laterality Date  . CIRCUMCISION      Family History:  Family History  Problem Relation Age of Onset  . Diabetes Paternal Grandmother   . Heart disease  Paternal Grandmother   . Diabetes Paternal Grandfather   . Heart disease Paternal Grandfather   . Cancer Paternal Grandfather       Social History: Lives with: [place] [employer].  Currently in Sophomore year of college.    Physical Exam:  Vitals:   02/13/17 1430  BP: 122/70  Pulse: 88  Weight: 172 lb 6.4 oz (78.2 kg)  Height: 5' 8.78" (1.747 m)   BP 122/70   Pulse 88   Ht 5' 8.78" (1.747 m)   Wt 172 lb 6.4 oz (78.2 kg)   BMI 25.62 kg/m  Body mass index: body mass index is 25.62 kg/m. Blood pressure percentiles are 52 % systolic and 28 % diastolic based on NHBPEP's 4th Report. Blood pressure percentile targets: 90: 135/92, 95: 139/96, 99 + 5 mmHg: 151/109.  Ht Readings from Last 3 Encounters:  02/13/17 5' 8.78" (1.747 m) (39 %, Z= -0.29)*  01/16/17 5' 9.09" (1.755 m) (43 %, Z= -0.17)*  12/11/16 5' 9.05" (1.754 m) (43 %, Z= -0.18)*   * Growth percentiles are based on CDC 2-20 Years data.   Wt Readings from Last 3 Encounters:  02/13/17 172 lb 6.4 oz (78.2 kg) (74 %, Z= 0.66)*  01/16/17 171 lb 12.8 oz (77.9 kg) (74 %, Z= 0.65)*  12/11/16 164 lb 3.2 oz (74.5 kg) (66 %, Z= 0.40)*   * Growth percentiles are based on CDC 2-20 Years data.    PHYSICAL EXAM:  General: Well developed, well nourished male in no acute distress.  Head: Normocephalic, atraumatic.   Eyes:  Pupils equal and round. EOMI.  Sclera white.  No eye drainage.  Wearing glasses today.  Ears/Nose/Mouth/Throat: Nares patent, no nasal drainage.  Normal dentition, mucous membranes moist.  Oropharynx intact. Neck: supple, no cervical lymphadenopathy, no thyromegaly Cardiovascular: regular rate, normal S1/S2, no murmurs Respiratory: No increased work of breathing.  Lungs clear to auscultation bilaterally.  No wheezes. Abdomen: soft, nontender, nondistended. Normal bowel sounds.  No appreciable masses  Extremities: warm, well perfused, cap refill < 2 sec.   Musculoskeletal: Normal muscle mass.   Normal strength Skin: warm, dry.  No rash or lesions. Neurologic: alert and oriented, normal speech and gait   Labs: Last hemoglobin A1c:  Lab Results  Component Value Date   HGBA1C 11.6 12/11/2016   Results for orders placed or performed in visit on 02/13/17  POCT Glucose (CBG)  Result Value Ref Range   POC Glucose 282 (A) 70 - 99 mg/dl    Assessment/Plan: [name] is a 21 y.o. male with type 1 diabetes in poor but improving control. [name] is now wearing CGM which is giving him better insight about his blood sugar trends. He has been having more lows during the morning and needs a decrease in his basal. He continues to run high overnight, he eats late at night after working out and often forgets to cover his carbs.  1. DM w/o complication type I, uncontrolled (HCC) - Discussed bolusing prior to eating to prevent spikes  - Change pump site every 3 days  - keep glucose at all times  - POCT Glucose (CBG) - Check blood sugar at least 4 x per day  - Should bolus 4-5 times per day. Remember to bolus at night  - Continue to wear Dexcom CGM.  - Reviewed blood sugar download with patient.   2. Essential hypertension Continue Lisinopril to 10mg  per day --> blood pressure is improved today.  Discussed importance of medications for renal protection and blood pressure control.  Discussed possible adverse effects of medication.  Discussed healthy diet and trying to make healthier choices when eating at cafeteria. Also stressed importance of exercise.   3. Maladaptive health behaviors affecting medical condition Answered all questions  - Praise given for improvements he has made - Discussed medtronic 670g. He is having his mother check with insurance.  - Stressed importance bolusing at night after eating large meal.   4. Insulin pump titration.   Basal Rates 12AM 1.25  4am 1.70  8am 1.75--> 1.65  2pm 1.25  8pm 1.40        Follow-up:   1 month    Gretchen Short, FNP-C

## 2017-02-13 NOTE — Patient Instructions (Signed)
-   8am: 1.75--> 1.650   - Make sure to give boluses at night  - Eat lower carb meals before bed - Give insulin before you eat  - At least 3-4 boluses  - Continue check blood sugars at least 4 x per day or wearing Dexcom.   - Look at 670g pump and let mom check with insurance  Follow up in one month for recheck

## 2017-02-26 DIAGNOSIS — E109 Type 1 diabetes mellitus without complications: Secondary | ICD-10-CM | POA: Diagnosis not present

## 2017-03-13 ENCOUNTER — Ambulatory Visit (INDEPENDENT_AMBULATORY_CARE_PROVIDER_SITE_OTHER): Payer: BLUE CROSS/BLUE SHIELD | Admitting: Family

## 2017-03-13 ENCOUNTER — Encounter (INDEPENDENT_AMBULATORY_CARE_PROVIDER_SITE_OTHER): Payer: Self-pay

## 2017-03-17 ENCOUNTER — Telehealth (INDEPENDENT_AMBULATORY_CARE_PROVIDER_SITE_OTHER): Payer: Self-pay

## 2017-03-17 NOTE — Telephone Encounter (Signed)
  Who's calling (name and relationship to patient) :Mom Tacey RuizLeah  Best contact number: [phone] Provider they see:  Reason for call:Mom is wanting to know if they need an appointment for changing to a new pump.     PRESCRIPTION REFILL ONLY  Name of prescription:  Pharmacy:

## 2017-03-18 NOTE — Telephone Encounter (Signed)
LVM to call back to schedule appointment for pump settings transfer to new 670G insulin pump.

## 2017-03-27 ENCOUNTER — Ambulatory Visit (INDEPENDENT_AMBULATORY_CARE_PROVIDER_SITE_OTHER): Payer: BLUE CROSS/BLUE SHIELD | Admitting: Family

## 2017-03-27 ENCOUNTER — Encounter (INDEPENDENT_AMBULATORY_CARE_PROVIDER_SITE_OTHER): Payer: Self-pay

## 2017-04-03 DIAGNOSIS — E1065 Type 1 diabetes mellitus with hyperglycemia: Secondary | ICD-10-CM | POA: Diagnosis not present

## 2017-04-03 DIAGNOSIS — E109 Type 1 diabetes mellitus without complications: Secondary | ICD-10-CM | POA: Diagnosis not present

## 2017-04-30 ENCOUNTER — Encounter (INDEPENDENT_AMBULATORY_CARE_PROVIDER_SITE_OTHER): Payer: Self-pay | Admitting: Family

## 2017-04-30 ENCOUNTER — Ambulatory Visit (INDEPENDENT_AMBULATORY_CARE_PROVIDER_SITE_OTHER): Payer: BLUE CROSS/BLUE SHIELD | Admitting: *Deleted

## 2017-04-30 ENCOUNTER — Ambulatory Visit (INDEPENDENT_AMBULATORY_CARE_PROVIDER_SITE_OTHER): Payer: BLUE CROSS/BLUE SHIELD | Admitting: Family

## 2017-04-30 VITALS — BP 126/78 | HR 68 | Wt 172.0 lb

## 2017-04-30 VITALS — BP 126/78 | HR 68 | Wt 172.2 lb

## 2017-04-30 DIAGNOSIS — IMO0001 Reserved for inherently not codable concepts without codable children: Secondary | ICD-10-CM

## 2017-04-30 DIAGNOSIS — I10 Essential (primary) hypertension: Secondary | ICD-10-CM

## 2017-04-30 DIAGNOSIS — F54 Psychological and behavioral factors associated with disorders or diseases classified elsewhere: Secondary | ICD-10-CM | POA: Diagnosis not present

## 2017-04-30 DIAGNOSIS — E1065 Type 1 diabetes mellitus with hyperglycemia: Principal | ICD-10-CM

## 2017-04-30 LAB — POCT GLUCOSE (DEVICE FOR HOME USE): POC Glucose: 362 mg/dl — AB (ref 70–99)

## 2017-04-30 LAB — POCT GLYCOSYLATED HEMOGLOBIN (HGB A1C): Hemoglobin A1C: 9

## 2017-04-30 NOTE — Patient Instructions (Signed)
-   Basal Changes   - 12am: 1.25--. 1.20  - 4am: 1.65--> 1.60  - 8am: 1.65--> 1.55  - 2pm: 1.25  - 8pm; 1.40   - Goal for next visit   - At least 2 check every day, No days with 0 checks.   - Follow up in 1 month

## 2017-04-30 NOTE — Progress Notes (Signed)
Insulin pump settings transferred to new 670G insulin pump  Start Time 3:00pm End Time 4:00pm  [name] was here for a follow up appointment with [name] and brought his new 670G insulin pump, wanting to transfer pump settings. [name] had made some adjustments to his settings, they are as follows:  Insulin pump settings: Basal Rates Time  U/hr 12a-4a  1.20 4a-8a  1.60 8a-2p  1.55 2p-8p  1.25 9p-12a  1.40 Total Basal 33.60  IC Ratio Time  Ratio 12-10:30a 12 10:30a-7p 10 7p-12a  8  Insulin Sensitivity Time  Sensitivity 12a-5a  45 5a-10:30a 40 10:30a-7p 30 7p-12a  35   BG Target Time  Target 12a-6a  150 6a-10:30p 120 10:30p-12a 150  Active Insulin Time  3 hours  Max Basal  2.00 U/Hr Max Bolus  20.0 Units Dual/square wave On / On BG Reminder   Off Easy Bolus   Off Missed Bolus Reminder On 11:30a-2p Low Reservoir alert On  40 units Set Change  On  3 days  Assessment / Plan: Patient was able to add new insulin pump settings to his new 670G insulin pump with no problems. Patient asked appropriate questions and was satisfied with responses.  [name] will come tomorrow and start Guardian sensor, we will add sensor settings then. Call our office if any questions or concerns regarding your diabetes or insulin pump.

## 2017-04-30 NOTE — Progress Notes (Signed)
Pediatric Endocrinology Diabetes Consultation Follow-up Visit  [name] 04-12-1996 [id number]  Chief Complaint: Follow-up type 1 diabetes   Pudlo, Gennie Alma, MD   HPI: [name]  is a 21 y.o. male presenting for follow-up of type 1 diabetes.   1. [name] was admitted to the pediatric intensive care unit of [facility] on 09/22/2006 for evaluation and treatment of new-onset type 1 diabetes mellitus, diabetic ketoacidosis, dehydration, and weight loss. He was started on Lantus as a basal insulin and Novolog aspart as a bolus insulin at mealtimes, bedtime, and 2 AM. He received additional diabetes education through our Diabetes Survival Skills Program and the [facility]. He was subsequently converted to an [employer] insulin pump.  2. Since last visit to [facility] on 12/17, he has been well.  No ER visits or hospitalizations.   Mishawn reports that he feels great. 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 He has been very active, working out for at least an hour per day. He is not checking his blood sugar very often but feels like when he does check it that "they are fine". He has not been wearing his CGM. He has a Medtronic 670g insulin pump with him today for pump training. He is taking 10 mg of Lisinopril "some days".    Insulin regimen: Medtronic 530g insulin pump  Basal Rates 12AM 1.25  4am 1.65  8am 1.65  2pm 1.25  8pm 1.40     Insulin to Carbohydrate Ratio 12AM 12  5am 12  1030am 10  4pm 10   7pm 8    Insulin Sensitivity Factor 12AM 45  5am 40  1030am 30  7pm 30      Target Blood Glucose 12AM 150  6am 110  9pm 150           Hypoglycemia: Able to feel low blood sugars.  No glucagon needed recently.  Blood glucose download: Checking Bg 1.4 times per day.   - He is Above range 58% and below range 18%.   - Pattern of low blood sugars between 8am-2pm  - He has 9 days in the last month with 0  blood sugar checks.  Med-alert ID: Not currently wearing. Injection sites: back and buttocks.  Annual labs due: 08/2017 Ophthalmology due: 2018. Discussed importance with patient today.     3. ROS: Greater than 10 systems reviewed with pertinent positives listed in HPI, otherwise neg. Constitutional: Reports he is feeling good. Has good appetite and energy.  Eyes: No changes in vision. He is due for eye exam. Cardiovascular: No palpitations. Respiratory: No increased work of breathing.  Gastrointestinal: No constipation or diarrhea. No abdominal pain Genitourinary: No nocturia, no polyuria Neurologic: Normal sensation, no tremor Endocrine: No polydipsia.  No hyperpigmentation Psychiatric: Normal affect  Past Medical History:   Past Medical History:  Diagnosis Date  . Diabetes mellitus   . Goiter   . Goiter   . Hypoglycemia associated with diabetes (HCC)   . Hypoglycemia associated with diabetes (HCC)   . Physical growth delay   . Thyroiditis, autoimmune     Medications:  Outpatient Encounter Prescriptions as of 04/30/2017  Medication Sig  . glucagon (GLUCAGON EMERGENCY) 1 MG injection Inject 1 mg intramuscularly in thigh one time for severe hypoglycemia (Patient taking differently: Inject 1 mg into the muscle once as needed (for severe hypoglycemia). )  . glucose blood (BAYER CONTOUR NEXT TEST) test strip Check  blood sugar 6-8 times per day  . Lancets (ACCU-CHEK MULTICLIX) lancets Use to test blood sugars 10 x daily  . lisinopril (PRINIVIL,ZESTRIL) 10 MG tablet Take 1 tablet (10 mg total) by mouth daily.  Marland Kitchen. NOVOLOG 100 UNIT/ML injection INJECT 300 UNITS IN INSULIN PUMP EVERY 48 HOURS   No facility-administered encounter medications on file as of 04/30/2017.     Allergies: No Known Allergies  Surgical History: Past Surgical History:  Procedure Laterality Date  . CIRCUMCISION      Family History:  Family History  Problem Relation Age of Onset  . Diabetes Paternal  Grandmother   . Heart disease Paternal Grandmother   . Diabetes Paternal Grandfather   . Heart disease Paternal Grandfather   . Cancer Paternal Grandfather       Social History: Lives with: Roommate in college [facility] at [employer].  Currently in Sophomore year of college.    Physical Exam:  Vitals:   04/30/17 1416  BP: 126/78  Pulse: 68  Weight: 172 lb 3.2 oz (78.1 kg)   BP 126/78   Pulse 68   Wt 172 lb 3.2 oz (78.1 kg)   BMI 25.59 kg/m  Body mass index: body mass index is 25.59 kg/m. No height on file for this encounter.  Ht Readings from Last 3 Encounters:  02/13/17 5' 8.78" (1.747 m) (39 %, Z= -0.29)*  01/16/17 5' 9.09" (1.755 m) (43 %, Z= -0.17)*  12/11/16 5' 9.05" (1.754 m) (43 %, Z= -0.18)*   * Growth percentiles are based on CDC 2-20 Years data.   Wt Readings from Last 3 Encounters:  04/30/17 172 lb 3.2 oz (78.1 kg) (73 %, Z= 0.63)*  02/13/17 172 lb 6.4 oz (78.2 kg) (74 %, Z= 0.66)*  01/16/17 171 lb 12.8 oz (77.9 kg) (74 %, Z= 0.65)*   * Growth percentiles are based on CDC 2-20 Years data.    PHYSICAL EXAM:  General: Well developed, well nourished male in no acute distress. He is very active and excited today.  Head: Normocephalic, atraumatic.   Eyes:  Pupils equal and round. EOMI.  Sclera white.  No eye drainage.  Wearing glasses today.  Ears/Nose/Mouth/Throat: Nares patent, no nasal drainage.  Normal dentition, mucous membranes moist.  Oropharynx intact. Neck: supple, no cervical lymphadenopathy, no thyromegaly Cardiovascular: regular rate, normal S1/S2, no murmurs Respiratory: No increased work of breathing.  Lungs clear to auscultation bilaterally.  No wheezes. Abdomen: soft, nontender, nondistended. Normal bowel sounds.  No appreciable masses  Extremities: warm, well perfused, cap refill < 2 sec.   Musculoskeletal: Normal muscle mass.  Normal strength Skin: warm, dry.  No rash or lesions. Neurologic: alert and oriented, normal speech and  gait   Labs: Last hemoglobin A1c:  Lab Results  Component Value Date   HGBA1C 9.0 04/30/2017   Results for orders placed or performed in visit on 04/30/17  POCT HgB A1C  Result Value Ref Range   Hemoglobin A1C 9.0   POCT Glucose (Device for Home Use)  Result Value Ref Range   Glucose Fasting, POC  70 - 99 mg/dL   POC Glucose 161362 (A) 70 - 99 mg/dl    Assessment/Plan: [name] is a 21 y.o. male with type 1 diabetes in poor control. [name] has improved his A1c from 11.6% to 9.0%. However, he is having more lows and is not checking his blood sugars enough. He needs basal reductions overnight and into the morning to reduce frequency of lows. He is starting 670g insulin pump and  hopes to have success with Guardian sensor.  1. DM w/o complication type I, uncontrolled (HCC) - Discussed bolusing prior to eating to prevent spikes  - Change pump site every 3 days  - keep glucose at all times  - POCT Glucose (CBG) - Check blood sugar at least 4 x per day  - Should bolus 4-5 times per day. Remember to bolus at night  - Reviewed blood sugar download with patient.  - Start 670g today.  2. Essential hypertension Continue Lisinopril to 10mg  per day Discussed importance of medications for renal protection and blood pressure control.  Discussed possible adverse effects of medication.  Discussed healthy diet and trying to make healthier choices when eating at cafeteria. Also stressed importance of exercise.   3. Maladaptive health behaviors affecting medical condition Answered all questions  - Set goal to have at least 2 checks every day at next appointment and 0 days with no checks.  - Discussed possible complications of uncontrolled T1DM.   4. Insulin pump titration.   Basal Rates 12AM 1.25--> 1.20   4am 1.65--> 1.60   8am  1.65--> 1.55  2pm 1.25  8pm 1.40    Follow-up:   1 month    Gretchen Short,  [facility]  Pediatric Specialist  [address]  [place], [address]  Tele:  [phone]  This visit lasted >25 minutes. More then 50% of visit was devoted to counseling, education and disease management.

## 2017-05-01 ENCOUNTER — Ambulatory Visit (INDEPENDENT_AMBULATORY_CARE_PROVIDER_SITE_OTHER): Payer: BLUE CROSS/BLUE SHIELD | Admitting: *Deleted

## 2017-05-01 ENCOUNTER — Encounter (INDEPENDENT_AMBULATORY_CARE_PROVIDER_SITE_OTHER): Payer: Self-pay

## 2017-05-05 ENCOUNTER — Ambulatory Visit (INDEPENDENT_AMBULATORY_CARE_PROVIDER_SITE_OTHER): Payer: BLUE CROSS/BLUE SHIELD | Admitting: Family

## 2017-05-28 ENCOUNTER — Ambulatory Visit (INDEPENDENT_AMBULATORY_CARE_PROVIDER_SITE_OTHER): Payer: BLUE CROSS/BLUE SHIELD | Admitting: Family

## 2017-06-20 DIAGNOSIS — E1065 Type 1 diabetes mellitus with hyperglycemia: Secondary | ICD-10-CM | POA: Diagnosis not present

## 2017-06-20 DIAGNOSIS — E109 Type 1 diabetes mellitus without complications: Secondary | ICD-10-CM | POA: Diagnosis not present

## 2017-07-11 ENCOUNTER — Other Ambulatory Visit (INDEPENDENT_AMBULATORY_CARE_PROVIDER_SITE_OTHER): Payer: BLUE CROSS/BLUE SHIELD | Admitting: *Deleted

## 2017-07-16 DIAGNOSIS — E109 Type 1 diabetes mellitus without complications: Secondary | ICD-10-CM | POA: Diagnosis not present

## 2017-07-16 DIAGNOSIS — E1065 Type 1 diabetes mellitus with hyperglycemia: Secondary | ICD-10-CM | POA: Diagnosis not present

## 2017-07-22 ENCOUNTER — Ambulatory Visit (INDEPENDENT_AMBULATORY_CARE_PROVIDER_SITE_OTHER): Payer: BLUE CROSS/BLUE SHIELD | Admitting: Family

## 2017-07-22 ENCOUNTER — Encounter (INDEPENDENT_AMBULATORY_CARE_PROVIDER_SITE_OTHER): Payer: Self-pay | Admitting: Family

## 2017-07-22 ENCOUNTER — Encounter (INDEPENDENT_AMBULATORY_CARE_PROVIDER_SITE_OTHER): Payer: Self-pay

## 2017-07-22 VITALS — BP 130/80 | HR 76 | Wt 167.0 lb

## 2017-07-22 DIAGNOSIS — Z9641 Presence of insulin pump (external) (internal): Secondary | ICD-10-CM

## 2017-07-22 DIAGNOSIS — E1065 Type 1 diabetes mellitus with hyperglycemia: Secondary | ICD-10-CM

## 2017-07-22 DIAGNOSIS — I10 Essential (primary) hypertension: Secondary | ICD-10-CM | POA: Diagnosis not present

## 2017-07-22 DIAGNOSIS — F54 Psychological and behavioral factors associated with disorders or diseases classified elsewhere: Secondary | ICD-10-CM | POA: Diagnosis not present

## 2017-07-22 DIAGNOSIS — IMO0001 Reserved for inherently not codable concepts without codable children: Secondary | ICD-10-CM

## 2017-07-22 LAB — POCT GLUCOSE (DEVICE FOR HOME USE): GLUCOSE FASTING, POC: 139 mg/dL — AB (ref 70–99)

## 2017-07-22 LAB — POCT GLYCOSYLATED HEMOGLOBIN (HGB A1C): Hemoglobin A1C: 9.2

## 2017-07-22 NOTE — Patient Instructions (Addendum)
Success with Auto mode  1. Calibrate at least 3 times per day   - First thing when waking up    - Before lunch   - Before bedtime  2. Bolus BEFORE eating  3. Dont miss you boluses    - Check your blood sugar!  - Rotate pump sites  - Schedule eye exam  - Follow up 1 month after starting Auto mode.   [email or web address]

## 2017-07-22 NOTE — Progress Notes (Signed)
Pediatric Endocrinology Diabetes Consultation Follow-up Visit  [name] 11/17/1996 [id number]  Chief Complaint: Follow-up type 1 diabetes   Pudlo, Erik Almaonald J, MD   HPI: [name]  is a 21 y.o. male presenting for follow-up of type 1 diabetes.   1. [name] was admitted to the pediatric intensive care unit of [facility] on 09/22/2006 for evaluation and treatment of new-onset type 1 diabetes mellitus, diabetic ketoacidosis, dehydration, and weight loss. He was started on Lantus as a basal insulin and Novolog aspart as a bolus insulin at mealtimes, bedtime, and 2 AM. He received additional diabetes education through our Diabetes Survival Skills Program and the [facility]. He was subsequently converted to an [employer] insulin pump.  2. Since last visit to [facility] on 05/2017, he has been well.  No ER visits or hospitalizations.   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 When he first started working he was having frequent low blood sugars so he reduced his basal rate, he did not call the office for assistance. Since making adjustments he has not had very many lows. He received the Guardian sensor for his 670g insulin pump and will get training on it soon. He has not been checking his blood sugar or bolusing frequently enough. He feels like his blood sugars have been "ok" despite not monitoring them closely.   He is taking Lisinopril for hypertension. He estimates he takes it 3-4 days per week. .   Insulin regimen: Medtronic 670g insulin pump  Basal Rates 12AM 1.20  4am 1.25  8am 1.30  2pm 1.25  8pm 1.40     Insulin to Carbohydrate Ratio 12AM 12     1030am 10     7pm 8    Insulin Sensitivity Factor 12AM 45  5am 40  1030am 30  7pm 35      Target Blood Glucose 12AM 150  6am 120  9pm 150           Hypoglycemia: Able to feel low blood sugars.  No glucagon needed recently.  Blood glucose download:  Checking Bg 1.9 times per day.   - Avg Bg 214  - he is using 48 units of insulin per day. 38% bolus and 62% basal   - 140 g of carbs per day  Med-alert ID: Not currently wearing. Injection sites: back and buttocks.  Annual labs due: 08/2017 Ophthalmology due: 2018. Discussed importance with patient today.     3. ROS: Greater than 10 systems reviewed with pertinent positives listed in HPI, otherwise neg. Review of Systems  Constitutional: Negative.  Negative for malaise/fatigue and weight loss.  HENT: Negative.   Eyes: Negative.  Negative for blurred vision and double vision.       Wears glasses   Respiratory: Negative for cough and shortness of breath.   Cardiovascular: Negative for chest pain and palpitations.  Gastrointestinal: Negative for abdominal pain, constipation and diarrhea.  Genitourinary: Negative for frequency.  Musculoskeletal: Negative for neck pain.  Skin: Negative for itching and rash.  Neurological: Negative for tremors, sensory change, weakness and headaches.  Endo/Heme/Allergies: Negative for polydipsia.  Psychiatric/Behavioral: Negative for depression. The patient is not nervous/anxious.      Past Medical History:   Past Medical History:  Diagnosis Date  . Diabetes mellitus   . Goiter   . Goiter   . Hypoglycemia associated with diabetes (HCC)   . Hypoglycemia associated with diabetes (HCC)   .  Physical growth delay   . Thyroiditis, autoimmune     Medications:  Outpatient Encounter Prescriptions as of 07/22/2017  Medication Sig  . glucagon (GLUCAGON EMERGENCY) 1 MG injection Inject 1 mg intramuscularly in thigh one time for severe hypoglycemia (Patient taking differently: Inject 1 mg into the muscle once as needed (for severe hypoglycemia). )  . glucose blood (BAYER CONTOUR NEXT TEST) test strip Check blood sugar 6-8 times per day  . Lancets (ACCU-CHEK MULTICLIX) lancets Use to test blood sugars 10 x daily  . lisinopril (PRINIVIL,ZESTRIL) 10 MG tablet  Take 1 tablet (10 mg total) by mouth daily.  Marland Kitchen. NOVOLOG 100 UNIT/ML injection INJECT 300 UNITS IN INSULIN PUMP EVERY 48 HOURS   No facility-administered encounter medications on file as of 07/22/2017.     Allergies: No Known Allergies  Surgical History: Past Surgical History:  Procedure Laterality Date  . CIRCUMCISION      Family History:  Family History  Problem Relation Age of Onset  . Diabetes Paternal Grandmother   . Heart disease Paternal Grandmother   . Diabetes Paternal Grandfather   . Heart disease Paternal Grandfather   . Cancer Paternal Grandfather       Social History: Lives with: Roommate in [place] dorm at [employer].  Currently in Sophomore year of college.    Physical Exam:  Vitals:   07/22/17 0901  BP: 130/80  Pulse: 76  Weight: 167 lb (75.8 kg)   BP 130/80   Pulse 76   Wt 167 lb (75.8 kg)   BMI 24.82 kg/m  Body mass index: body mass index is 24.82 kg/m. No height on file for this encounter.  Ht Readings from Last 3 Encounters:  02/13/17 5' 8.78" (1.747 m) (39 %, Z= -0.29)*  01/16/17 5' 9.09" (1.755 m) (43 %, Z= -0.17)*  12/11/16 5' 9.05" (1.754 m) (43 %, Z= -0.18)*   * Growth percentiles are based on CDC 2-20 Years data.   Wt Readings from Last 3 Encounters:  07/22/17 167 lb (75.8 kg) (66 %, Z= 0.42)*  04/30/17 172 lb 3.2 oz (78.1 kg) (73 %, Z= 0.63)*  04/30/17 172 lb (78 kg) (73 %, Z= 0.62)*   * Growth percentiles are based on CDC 2-20 Years data.    PHYSICAL EXAM:  General: Well developed, well nourished male in no acute distress. He is talkative and engaged during visit.  Head: Normocephalic, atraumatic.   Eyes:  Pupils equal and round. EOMI.  Sclera white.  Wearing glasses today.  Ears/Nose/Mouth/Throat: Nares patent,.  Normal dentition, mucous membranes moist.  Oropharynx intact. Neck: supple, no cervical lymphadenopathy, no thyromegaly Cardiovascular: regular rate, normal S1/S2, no murmurs Respiratory: No increased work of  breathing.  Lungs clear to auscultation bilaterally.  No wheezes. Abdomen: soft, nontender, nondistended. Normal bowel sounds.  No appreciable masses  Extremities: warm, well perfused, cap refill < 2 sec.   Musculoskeletal: Normal muscle mass.  Normal strength Skin: warm, dry.  No rash or lesions. Neurologic: alert and oriented, normal speech and gait   Labs: Last hemoglobin A1c:  Lab Results  Component Value Date   HGBA1C 9.2 07/22/2017   Results for orders placed or performed in visit on 07/22/17  POCT Glucose (Device for Home Use)  Result Value Ref Range   Glucose Fasting, POC 139 (A) 70 - 99 mg/dL   POC Glucose  70 - 99 mg/dl  POCT HgB G3OA1C  Result Value Ref Range   Hemoglobin A1C 9.2     Assessment/Plan: [name] is a  21 y.o. male with type 1 diabetes in poor control. Danta has a Hemoglobin A1c of 9.3% today. He needs to check his blood sugar more frequently and make sure to bolus before meals. He will start Auto mode with the Medtronic 670g insulin pump soon.   1. DM w/o complication type I, uncontrolled (HCC - Change pump site every 3 days  - keep glucose at all times  - POCT Glucose (CBG) - Check blood sugar at least 4 x per day  - Reviewed blood sugar download with patient.  - Discussed tips for success when starting auto mode on pump--> bolus before meals, calibrate at least 3 x per day    2. Essential hypertension Continue Lisinopril to 10mg  per day. Encouraged him to take it everyday.  Discussed importance of medications for renal protection and blood pressure control.   3. Maladaptive health behaviors affecting medical condition Answered all questions  - Discussed possible complications of uncontrolled T1DM.   4. Insulin pump titration.  None today.   Follow-up:   1 month    Gretchen Short,  [facility]  Pediatric Specialist  [address]  [place], [address]  Tele: [phone]  This visit lasted >25 minutes. More then 50% of visit was devoted to  counseling, education and disease management.

## 2017-07-31 ENCOUNTER — Ambulatory Visit (INDEPENDENT_AMBULATORY_CARE_PROVIDER_SITE_OTHER): Payer: BLUE CROSS/BLUE SHIELD | Admitting: *Deleted

## 2017-07-31 ENCOUNTER — Encounter (INDEPENDENT_AMBULATORY_CARE_PROVIDER_SITE_OTHER): Payer: Self-pay | Admitting: *Deleted

## 2017-07-31 VITALS — BP 120/68 | HR 68 | Ht 68.9 in | Wt 166.0 lb

## 2017-07-31 DIAGNOSIS — E1065 Type 1 diabetes mellitus with hyperglycemia: Secondary | ICD-10-CM

## 2017-07-31 DIAGNOSIS — IMO0001 Reserved for inherently not codable concepts without codable children: Secondary | ICD-10-CM

## 2017-07-31 LAB — POCT GLUCOSE (DEVICE FOR HOME USE): Glucose Fasting, POC: 64 mg/dL — AB (ref 70–99)

## 2017-08-04 NOTE — Progress Notes (Signed)
Guardian Sensor start    Start time 8:00am End Time 9am   [name] was here for the start of the guardian sensor on his 670G insulin pump. He has been using his 670G pump for a couple of months and just received the guardian sensors this week.  Review indications for use, contraindications, warnings and precautions of Guardian CGM.  the CGM is to be used to help them monitor the blood sugars.  The sensor and the transmitter are waterproof however the receiver is not.  Please remove the CGM sensor before any X-ray or CT scan or MRI procedures.  .  Demonstrated and showed patient and parent using a demo device to enter blood glucose readings and adjusting the lows and the high alerts on the receiver.  Customize the CGM software features and settings based on the provider and parent's needs.  Sensor settings High Alert   On 12a-12a  350 mg/dL Alert on Rising  Off Snooze  on  2 hours  Low Alert   On 12a-12a  80 mg/dL  Alert before low Off Alert on Low  On Suspend on Low Off Suspend before low On Snooze   15 mins Alert Silence   Off   Showed and demonstrated patient how to apply a demo CGM sensor,  Once patient showed and demonstrated and verbalized understanding the steps then he proceeded to apply the sensor. Patient chose Right Upper quadrant,  cleaned the area using alcohol,  then applied Skin Tac adhesive in a circular motion,  then applied applicator and inserted the sensor.  Patient tolerated very well the procedure,  Then patient started sensor on insulin pump.  Showed and demonstrated patient and parents to look for the sensor antenna on the insulin pump wait 10- 15 minutes. After receiver showed communication with antenna, explain to patient the importance of calibrating the  CGM in two hours and then again in six hours making sure not to calibrate when blood sugar is changing fast, with the arrows pointing UP or DOWN   Assessment / Plan Patient participated in hand son  training and asked appropriate questions.  Patient tolerates well the insertion of the Guardian sensor with no problems. Reminded him to calibrate sensor 4-5 times a day better before meals and bedtime. Try to Bolus for your meals 15 minutes before, so that blood sugars wont spike up after meals.  Scheduled Auto Mode start for September 6th, same day as provider visit. Call our office if any questions or concerns regarding your pump or sensor.

## 2017-08-11 DIAGNOSIS — E1065 Type 1 diabetes mellitus with hyperglycemia: Secondary | ICD-10-CM | POA: Diagnosis not present

## 2017-08-11 DIAGNOSIS — E109 Type 1 diabetes mellitus without complications: Secondary | ICD-10-CM | POA: Diagnosis not present

## 2017-08-28 ENCOUNTER — Ambulatory Visit (INDEPENDENT_AMBULATORY_CARE_PROVIDER_SITE_OTHER): Payer: BLUE CROSS/BLUE SHIELD | Admitting: Family

## 2017-08-28 ENCOUNTER — Other Ambulatory Visit (INDEPENDENT_AMBULATORY_CARE_PROVIDER_SITE_OTHER): Payer: BLUE CROSS/BLUE SHIELD | Admitting: *Deleted

## 2017-11-06 DIAGNOSIS — E109 Type 1 diabetes mellitus without complications: Secondary | ICD-10-CM | POA: Diagnosis not present

## 2017-11-06 DIAGNOSIS — E1065 Type 1 diabetes mellitus with hyperglycemia: Secondary | ICD-10-CM | POA: Diagnosis not present

## 2018-01-05 ENCOUNTER — Other Ambulatory Visit (INDEPENDENT_AMBULATORY_CARE_PROVIDER_SITE_OTHER): Payer: Self-pay | Admitting: *Deleted

## 2018-01-05 ENCOUNTER — Telehealth (INDEPENDENT_AMBULATORY_CARE_PROVIDER_SITE_OTHER): Payer: Self-pay | Admitting: Family

## 2018-01-05 DIAGNOSIS — IMO0001 Reserved for inherently not codable concepts without codable children: Secondary | ICD-10-CM

## 2018-01-05 DIAGNOSIS — E1065 Type 1 diabetes mellitus with hyperglycemia: Principal | ICD-10-CM

## 2018-01-05 MED ORDER — INSULIN ASPART 100 UNIT/ML ~~LOC~~ SOLN: SUBCUTANEOUS | 3 refills | Status: DC

## 2018-01-05 NOTE — Telephone Encounter (Signed)
LVM advising that rx for Novolog was [facility].

## 2018-01-05 NOTE — Telephone Encounter (Signed)
°  Who's calling (name and relationship to patient) : Tacey RuizLeah, mother Best contact number: [phone] Provider they see: Gretchen ShortSpenser Beasley Reason for call: Mother left message at 1:10pm for a medication refill. I returned her call and left her a message that I would let the clinic know.     PRESCRIPTION REFILL ONLY  Name of prescription: Novolog  Pharmacy: [facility]

## 2018-01-14 ENCOUNTER — Encounter: Payer: Self-pay | Admitting: Physician Assistant

## 2018-01-14 ENCOUNTER — Other Ambulatory Visit: Payer: Self-pay

## 2018-01-14 ENCOUNTER — Ambulatory Visit: Payer: BLUE CROSS/BLUE SHIELD | Admitting: Physician Assistant

## 2018-01-14 VITALS — BP 142/86 | HR 103 | Temp 99.2°F | Resp 16 | Ht 68.0 in | Wt 179.8 lb

## 2018-01-14 DIAGNOSIS — Z8639 Personal history of other endocrine, nutritional and metabolic disease: Secondary | ICD-10-CM | POA: Diagnosis not present

## 2018-01-14 DIAGNOSIS — R05 Cough: Secondary | ICD-10-CM | POA: Diagnosis not present

## 2018-01-14 DIAGNOSIS — J111 Influenza due to unidentified influenza virus with other respiratory manifestations: Secondary | ICD-10-CM

## 2018-01-14 DIAGNOSIS — R69 Illness, unspecified: Principal | ICD-10-CM

## 2018-01-14 DIAGNOSIS — R509 Fever, unspecified: Secondary | ICD-10-CM

## 2018-01-14 LAB — POCT INFLUENZA A/B
Influenza A, POC: NEGATIVE
Influenza B, POC: NEGATIVE

## 2018-01-14 MED ORDER — OSELTAMIVIR PHOSPHATE 75 MG PO CAPS: 75.0000 mg | ORAL_CAPSULE | Freq: Two times a day (BID) | ORAL | 0 refills | Status: AC

## 2018-01-14 MED ORDER — IBUPROFEN 800 MG PO TABS: 400.0000 mg | ORAL_TABLET | Freq: Three times a day (TID) | ORAL | 0 refills | Status: AC

## 2018-01-14 NOTE — Patient Instructions (Addendum)
Please get plenty of rest.  If at any point you are getting worse I will see you back in the clinic.  Please keep a tight check on your blood sugar while you are sick and adjust her insulin accordingly.  There are very few side effects associated with Tamiflu given that you have flulike symptoms which should certainly treat given your history of diabetes.  Take the NSAID as I have prescribed as it will help you feel better.    IF you received an x-ray today, you will receive an invoice from [facility]. Please contact [facility] at [phone] with questions or concerns regarding your invoice.   IF you received labwork today, you will receive an invoice from [facility]. Please contact [facility] at [phone] with questions or concerns regarding your invoice.   Our billing staff will not be able to assist you with questions regarding bills from these companies.  You will be contacted with the lab results as soon as they are available. The fastest way to get your results is to activate your My Chart account. Instructions are located on the last page of this paperwork. If you have not heard from us regarding the results in 2 weeks, please contact this office.

## 2018-01-14 NOTE — Progress Notes (Signed)
01/14/2018 10:27 AM   DOB: 08/06/1996 / MRN: [id number]  SUBJECTIVE:  [name] is a 22 y.o. male presenting for cough, fever, myalgia.  This started about 48 hours ago and he feels that he is not getting better.  He has a history of type 1 diabetes.  He did not have a flu shot this year.  He was able to go to school yesterday. Associate sore throat but this is not a prominent symptom.    He has No Known Allergies.  He  has a past medical history of Diabetes mellitus, Goiter, Goiter, Hypoglycemia associated with diabetes (HCC), Hypoglycemia associated with diabetes (HCC), Physical growth delay, and Thyroiditis, autoimmune.    He  reports that  has never smoked. he has never used smokeless tobacco. He reports that he does not drink alcohol or use drugs. He  reports that he does not engage in sexual activity. The patient  has a past surgical history that includes Circumcision.  His family history includes Cancer in his paternal grandfather; Diabetes in his paternal grandfather and paternal grandmother; Heart disease in his paternal grandfather and paternal grandmother.  ROS   Per HPI   The problem list and medications were reviewed and updated by myself where necessary and exist elsewhere in the encounter.   OBJECTIVE:  BP (!) 142/86 (BP Location: Left Arm, Patient Position: Sitting, Cuff Size: Normal)   Pulse (!) 103   Temp 99.2 F (37.3 C) (Oral)   Resp 16   Ht 5\' 8"  (1.727 m)   Wt 179 lb 12.8 oz (81.6 kg)   SpO2 98%   BMI 27.34 kg/m   Physical Exam  Constitutional: He appears well-developed. He is active and cooperative.  Non-toxic appearance.  HENT:  Right Ear: Hearing, tympanic membrane, external ear and ear canal normal.  Left Ear: Hearing, tympanic membrane, external ear and ear canal normal.  Nose: Nose normal. Right sinus exhibits no maxillary sinus tenderness and no frontal sinus tenderness. Left sinus exhibits no maxillary sinus tenderness and no frontal sinus  tenderness.  Mouth/Throat: Uvula is midline, oropharynx is clear and moist and mucous membranes are normal. No oropharyngeal exudate, posterior oropharyngeal edema or tonsillar abscesses.  Eyes: Conjunctivae are normal. Pupils are equal, round, and reactive to light.  Cardiovascular: Normal rate, regular rhythm, S1 normal, S2 normal, normal heart sounds, intact distal pulses and normal pulses. Exam reveals no gallop and no friction rub.  No murmur heard. Pulmonary/Chest: Effort normal. No stridor. No tachypnea. No respiratory distress. He has no wheezes. He has no rales.  Abdominal: He exhibits no distension.  Musculoskeletal: He exhibits no edema.  Lymphadenopathy:       Head (right side): No submandibular and no tonsillar adenopathy present.       Head (left side): No submandibular and no tonsillar adenopathy present.    He has no cervical adenopathy.  Neurological: He is alert.  Skin: Skin is warm and dry. He is not diaphoretic. No pallor.  Vitals reviewed.   Results for orders placed or performed in visit on 01/14/18 (from the past 72 hour(s))  POCT Influenza A/B     Status: None   Collection Time: 01/14/18 10:13 AM  Result Value Ref Range   Influenza A, POC Negative Negative   Influenza B, POC Negative Negative    No results found.  ASSESSMENT AND PLAN:  [name] was seen today for generalized body aches.  Diagnoses and all orders for this visit:  Influenza-like illness: He has not  had the flu shot I do not trust the sensitivity of the flu swab.  Given history of problem to cover for the fluid is we are within the treatment window.  Advised he rest keep a close eye on his sugar and drink lots of fluids. -     oseltamivir (TAMIFLU) 75 MG capsule; Take 1 capsule (75 mg total) by mouth 2 (two) times daily. -     ibuprofen (ADVIL,MOTRIN) 800 MG tablet; Take 0.5-1 tablets (400-800 mg total) by mouth 3 (three) times daily for 10 days. Take with food. -     POCT Influenza A/B  History  of type 1 diabetes mellitus    The patient is advised to call or return to clinic if he does not see an improvement in symptoms, or to seek the care of the closest emergency department if he worsens with the above plan.   Deliah Boston, MHS, PA-C Primary Care at [facility] 01/14/2018 10:27 AM

## 2018-03-09 ENCOUNTER — Encounter (INDEPENDENT_AMBULATORY_CARE_PROVIDER_SITE_OTHER): Payer: Self-pay | Admitting: *Deleted

## 2018-03-09 DIAGNOSIS — E1065 Type 1 diabetes mellitus with hyperglycemia: Secondary | ICD-10-CM | POA: Diagnosis not present

## 2018-03-09 DIAGNOSIS — E109 Type 1 diabetes mellitus without complications: Secondary | ICD-10-CM | POA: Diagnosis not present

## 2018-05-11 ENCOUNTER — Ambulatory Visit (INDEPENDENT_AMBULATORY_CARE_PROVIDER_SITE_OTHER): Payer: BLUE CROSS/BLUE SHIELD | Admitting: Family

## 2018-06-03 DIAGNOSIS — E109 Type 1 diabetes mellitus without complications: Secondary | ICD-10-CM | POA: Diagnosis not present

## 2018-06-03 DIAGNOSIS — E1065 Type 1 diabetes mellitus with hyperglycemia: Secondary | ICD-10-CM | POA: Diagnosis not present

## 2018-06-12 ENCOUNTER — Encounter (INDEPENDENT_AMBULATORY_CARE_PROVIDER_SITE_OTHER): Payer: Self-pay | Admitting: Family

## 2018-06-12 ENCOUNTER — Ambulatory Visit (INDEPENDENT_AMBULATORY_CARE_PROVIDER_SITE_OTHER): Payer: BLUE CROSS/BLUE SHIELD | Admitting: Family

## 2018-06-12 VITALS — BP 122/78 | HR 90 | Ht 69.21 in | Wt 169.0 lb

## 2018-06-12 DIAGNOSIS — R739 Hyperglycemia, unspecified: Secondary | ICD-10-CM | POA: Insufficient documentation

## 2018-06-12 DIAGNOSIS — IMO0001 Reserved for inherently not codable concepts without codable children: Secondary | ICD-10-CM

## 2018-06-12 DIAGNOSIS — E1065 Type 1 diabetes mellitus with hyperglycemia: Secondary | ICD-10-CM

## 2018-06-12 DIAGNOSIS — Z9641 Presence of insulin pump (external) (internal): Secondary | ICD-10-CM | POA: Diagnosis not present

## 2018-06-12 DIAGNOSIS — I10 Essential (primary) hypertension: Secondary | ICD-10-CM

## 2018-06-12 DIAGNOSIS — R7309 Other abnormal glucose: Secondary | ICD-10-CM | POA: Diagnosis not present

## 2018-06-12 DIAGNOSIS — F54 Psychological and behavioral factors associated with disorders or diseases classified elsewhere: Secondary | ICD-10-CM | POA: Diagnosis not present

## 2018-06-12 LAB — POCT GLYCOSYLATED HEMOGLOBIN (HGB A1C): Hemoglobin A1C: 10.5 % — AB (ref 4.0–5.6)

## 2018-06-12 LAB — POCT GLUCOSE (DEVICE FOR HOME USE): GLUCOSE FASTING, POC: 176 mg/dL — AB (ref 70–99)

## 2018-06-12 NOTE — Patient Instructions (Signed)
-   Bolus!  - Check blood sugars   - 4 x per day   - Always before driving   - Gaol for next appointment is at least 2 checks per day  - Make appointment for Behavioral health   - Follow up in 6 weeks.

## 2018-06-12 NOTE — Progress Notes (Signed)
Pediatric Endocrinology Diabetes Consultation Follow-up Visit  [name] 05/09/1996 [id number]  Chief Complaint: Follow-up type 1 diabetes   Pudlo, Gennie Almaonald J, MD   HPI: [name]  is a 22 y.o. male presenting for follow-up of type 1 diabetes.   1. [name] was admitted to the pediatric intensive care unit [facility] 09/[facility] for evaluation and treatment of new-onset type 1 diabetes mellitus, diabetic ketoacidosis, dehydration, and weight loss. He was started on Lantus as a basal insulin and Novolog aspart as a bolus insulin at mealtimes, bedtime, and 2 AM. He received additional diabetes education through our Diabetes Survival Skills Program and the [facility]. He was subsequently converted to an [employer] insulin pump.  2. Since last visit to [facility] on 07/22/2017, he has been well.    He reports that he has not followed up in almost a year because he had become very frustrated with his diabetes. Due to frustration, he stopped taking care of his diabetes as well as he should. He has not been checking blood sugars frequently and does not always bolus when he eats. He feels like his blood sugars have been running higher. He is trying to get back on track now. He is using Medtronic 670g insulin pump but does not like the CGM sensor.   He is suppose to take 10 mg of Lisinopril per day for hypertension. He has not been taking it consistently.    Insulin regimen: Medtronic 670g insulin pump  Basal Rates 12AM 1.20  4am 1.25  8am 1.30  2pm 1.25  8pm 1.40     Insulin to Carbohydrate Ratio 12AM 12     1030am 10     7pm 8    Insulin Sensitivity Factor 12AM 45  5am 40  1030am 30  7pm 35      Target Blood Glucose 12AM 150  6am 120  9pm 150           Hypoglycemia: Able to feel low blood sugars.  No glucagon needed recently.  Insulin pump download:   - Avg Bg 243. Checking 1.1 x per day   - Using 54 units per day. 44% bolus and  56% basal   - Entering 185 grams of carbs per day.  Med-alert ID: Not currently wearing. Injection sites: back and buttocks.  Annual labs due: 05/2019 --> ordered today.  Ophthalmology due: 2018. Discussed importance with patient today.     3. ROS: Greater than 10 systems reviewed with pertinent positives listed in HPI, otherwise neg. Review of Systems  Constitutional: Negative.  Negative for malaise/fatigue and weight loss.  HENT: Negative.   Eyes: Negative.  Negative for blurred vision and double vision.       Wears glasses   Respiratory: Negative for cough and shortness of breath.   Cardiovascular: Negative for chest pain and palpitations.  Gastrointestinal: Negative for abdominal pain, constipation and diarrhea.  Genitourinary: Negative for frequency.  Musculoskeletal: Negative for neck pain.  Skin: Negative for itching and rash.  Neurological: Negative for tremors, sensory change, weakness and headaches.  Endo/Heme/Allergies: Negative for polydipsia.  Psychiatric/Behavioral: Negative for depression. The patient is not nervous/anxious.      Past Medical History:   Past Medical History:  Diagnosis Date  . Diabetes mellitus   . Goiter   . Goiter   . Hypoglycemia associated with diabetes (HCC)   . Hypoglycemia associated with diabetes (HCC)   . Physical growth delay   . Thyroiditis,  autoimmune     Medications:  Outpatient Encounter Medications as of 06/12/2018  Medication Sig  . glucagon (GLUCAGON EMERGENCY) 1 MG injection Inject 1 mg intramuscularly in thigh one time for severe hypoglycemia (Patient taking differently: Inject 1 mg into the muscle once as needed (for severe hypoglycemia). )  . glucose blood (BAYER CONTOUR NEXT TEST) test strip Check blood sugar 6-8 times per day  . insulin aspart (NOVOLOG) 100 UNIT/ML injection 300 UNITS IN INSULIN PUMP EVERY 48 HOURS  . Lancets (ACCU-CHEK MULTICLIX) lancets Use to test blood sugars 10 x daily  . lisinopril  (PRINIVIL,ZESTRIL) 10 MG tablet Take 1 tablet (10 mg total) by mouth daily.  Marland Kitchen oseltamivir (TAMIFLU) 75 MG capsule Take 1 capsule (75 mg total) by mouth 2 (two) times daily. (Patient not taking: Reported on 06/12/2018)   No facility-administered encounter medications on file as of 06/12/2018.     Allergies: No Known Allergies  Surgical History: Past Surgical History:  Procedure Laterality Date  . CIRCUMCISION      Family History:  Family History  Problem Relation Age of Onset  . Diabetes Paternal Grandmother   . Heart disease Paternal Grandmother   . Diabetes Paternal Grandfather   . Heart disease Paternal Grandfather   . Cancer Paternal Grandfather       Social History: Lives with: Roommate in college dorm at [employer].  Currently in Sophomore year of college.    Physical Exam:  Vitals:   06/12/18 0912  BP: 122/78  Pulse: 90  Weight: 169 lb (76.7 kg)  Height: 5' 9.21" (1.758 m)   BP 122/78   Pulse 90   Ht 5' 9.21" (1.758 m)   Wt 169 lb (76.7 kg)   BMI 24.80 kg/m  Body mass index: body mass index is 24.8 kg/m. Growth percentile SmartLinks can only be used for patients less than 24 years old.  Ht Readings from Last 3 Encounters:  06/12/18 5' 9.21" (1.758 m)  01/14/18 5\' 8"  (1.727 m)  07/31/17 5' 8.9" (1.75 m) (40 %, Z= -0.26)*   * Growth percentiles are based on CDC (Boys, 2-20 Years) data.   Wt Readings from Last 3 Encounters:  06/12/18 169 lb (76.7 kg)  01/14/18 179 lb 12.8 oz (81.6 kg)  07/31/17 166 lb (75.3 kg) (65 %, Z= 0.39)*   * Growth percentiles are based on CDC (Boys, 2-20 Years) data.    PHYSICAL EXAM:  General: Well developed, well nourished male in no acute distress.  He is alert and oriented.  Head: Normocephalic, atraumatic.   Eyes:  Pupils equal and round. EOMI.  Sclera white.  No eye drainage.  + glasses  Ears/Nose/Mouth/Throat: Nares patent, no nasal drainage.  Normal dentition, mucous membranes moist.  Neck: supple, no cervical  lymphadenopathy, no thyromegaly Cardiovascular: regular rate, normal S1/S2, no murmurs Respiratory: No increased work of breathing.  Lungs clear to auscultation bilaterally.  No wheezes. Abdomen: soft, nontender, nondistended. Normal bowel sounds.  No appreciable masses  Extremities: warm, well perfused, cap refill < 2 sec.   Musculoskeletal: Normal muscle mass.  Normal strength Skin: warm, dry.  No rash or lesions. + insulin pump site to buttocks.  Neurologic: alert and oriented, normal speech, no tremor    Labs: Last hemoglobin A1c: 9.2%  Lab Results  Component Value Date   HGBA1C 10.5 (A) 06/12/2018   Results for orders placed or performed in visit on 06/12/18  POCT Glucose (Device for Home Use)  Result Value Ref Range   Glucose Fasting,  POC 176 (A) 70 - 99 mg/dL   POC Glucose  70 - 99 mg/dl  POCT HgB Z6X  Result Value Ref Range   Hemoglobin A1C 10.5 (A) 4.0 - 5.6 %   HbA1c POC (<> result, manual entry)  4.0 - 5.6 %   HbA1c, POC (prediabetic range)  5.7 - 6.4 %   HbA1c, POC (controlled diabetic range)  0.0 - 7.0 %    Assessment/Plan: Haseeb is a 22 y.o. male with type 1 diabetes in poor and worsening control on insulin pump therapy. Zuhair is struggling with diabetes care and control. He has gone much longer then recommended between follow up. He is not checking blood sugar frequently enough and is missing insulin doses for carb intake. His hemoglobin A1c has increased to 10.5% which is higher then the ADA goal of <7% for adults.   1. DM w/o complication type I, uncontrolled (HCC)/Hyperglycemia/elevated a1c  - Medtronic 670g insulin pump  - Rotate pump site every 3 days to prevent scar tissue.  - Bolus 15 minutes before eating   - Must bolus before ALL carb intake.  - Check bg at least 4 x per day if not wearing CGM.   - Always check before driving.  - Wear medical alert.  - POCT glucose  - POCT hemoglobin A1c  - Annual labs: TFTs, lipid panel, Microalbumin, CMP   2.  Essential hypertension - 10 mg of Lisinopril daily  - Discussed importance of this medication for both renal protection and to lower blood pressure.   3. Maladaptive health behaviors affecting medical condition - Discussed barriers to care.  - Advised that he must keep follow up appointments.  - Refer to behavioral health for frustration with diabetes and to help develop strategies to check blood sugars/bolus  - Reviewed possible complications of uncontrolled T1DM.   4. Insulin pump titration.  None today. Pump in place.   Follow-up:   2 months.   I have spent >40  minutes with >50% of time in counseling, education and instruction. When a patient is on insulin, intensive monitoring of blood glucose levels is necessary to avoid hyperglycemia and hypoglycemia. Severe hyperglycemia/hypoglycemia can lead to hospital admissions and be life threatening.   Gretchen Short,  FNP-C  Pediatric Specialist  [address]  [place], [address]  Tele: [phone]

## 2018-06-13 LAB — COMPREHENSIVE METABOLIC PANEL
AG Ratio: 1.6 (calc) (ref 1.0–2.5)
ALKALINE PHOSPHATASE (APISO): 87 U/L (ref 40–115)
ALT: 10 U/L (ref 9–46)
AST: 15 U/L (ref 10–40)
Albumin: 4.5 g/dL (ref 3.6–5.1)
BUN: 16 mg/dL (ref 7–25)
CO2: 25 mmol/L (ref 20–32)
CREATININE: 1.13 mg/dL (ref 0.60–1.35)
Calcium: 9.9 mg/dL (ref 8.6–10.3)
Chloride: 102 mmol/L (ref 98–110)
Globulin: 2.8 g/dL (calc) (ref 1.9–3.7)
Glucose, Bld: 178 mg/dL — ABNORMAL HIGH (ref 65–99)
Potassium: 4 mmol/L (ref 3.5–5.3)
Sodium: 138 mmol/L (ref 135–146)
Total Bilirubin: 1.4 mg/dL — ABNORMAL HIGH (ref 0.2–1.2)
Total Protein: 7.3 g/dL (ref 6.1–8.1)

## 2018-06-13 LAB — TSH: TSH: 1.6 m[IU]/L (ref 0.40–4.50)

## 2018-06-13 LAB — MICROALBUMIN / CREATININE URINE RATIO
Creatinine, Urine: 148 mg/dL (ref 20–320)
MICROALB UR: 0.6 mg/dL
Microalb Creat Ratio: 4 mcg/mg creat (ref <30)

## 2018-06-13 LAB — LIPID PANEL
Cholesterol: 201 mg/dL — ABNORMAL HIGH (ref <200)
HDL: 58 mg/dL (ref >40)
LDL Cholesterol (Calc): 124 mg/dL (calc) — ABNORMAL HIGH
NON-HDL CHOLESTEROL (CALC): 143 mg/dL — AB (ref <130)
Total CHOL/HDL Ratio: 3.5 (calc) (ref <5.0)
Triglycerides: 88 mg/dL (ref <150)

## 2018-06-13 LAB — T4, FREE: FREE T4: 1.2 ng/dL (ref 0.8–1.4)

## 2018-07-02 ENCOUNTER — Institutional Professional Consult (permissible substitution) (INDEPENDENT_AMBULATORY_CARE_PROVIDER_SITE_OTHER): Payer: BLUE CROSS/BLUE SHIELD | Admitting: Licensed Clinical Social Worker

## 2018-07-09 ENCOUNTER — Institutional Professional Consult (permissible substitution) (INDEPENDENT_AMBULATORY_CARE_PROVIDER_SITE_OTHER): Payer: BLUE CROSS/BLUE SHIELD | Admitting: Licensed Clinical Social Worker

## 2018-07-30 NOTE — BH Specialist Note (Signed)
Integrated Behavioral Health Initial Visit  MRN: [id number] Name: [name]  Number of Integrated Behavioral Health Clinician visits:: 1/6 Session Start time: 2:01 PM  Session End time: 2:29 PM Total time: 28 minutes  Type of Service: Integrated Behavioral Health- Individual/Family Interpretor:No. Interpretor Name and Language: N/A   SUBJECTIVE: Erik Holean Osowski is a 22 y.o. male accompanied by self Patient was referred by Gretchen ShortSpenser Beasley, NP for diabetes burnout. Patient reports the following symptoms/concerns: lost to follow-up for about a year until visit on 06/12/18. Was very frustrated with diabetes and stopped taking care of it like he should (not checking BG frequently, not always bolusing when eating). Since then, feels like he is bolusing and isn't as frustrated, but is forgetting to check BG most of the time because he is out of the habit. Not using sensor because does not like wearing another device on his body and doesn't like all of the alerts. 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 Demonstrate ability to: Increase motivation to adhere to plan of care  INTERVENTIONS: Interventions utilized: Motivational Interviewing and Solution-Focused Strategies  Standardized Assessments completed: Not Needed  ASSESSMENT: Patient currently experiencing difficulty completing BG checks consistently. Used MI to figure out motivations and what has worked in the past. Problem-solved how to remember checks.    Patient may benefit from improving diabetes care.  PLAN: 1. Follow up with behavioral health clinician on : joint  with [name] 2. Behavioral recommendations: set auditory & visual reminders (alarms & sticky notes) to check BG morning & when eating. Put sticky note on car as reminder of motivation (keeping his license) 3. Referral(s[facility] 4. "From scale of 1-10, how likely are you to follow plan?": likely  Fredrika Canby E, LCSW

## 2018-08-06 ENCOUNTER — Ambulatory Visit (INDEPENDENT_AMBULATORY_CARE_PROVIDER_SITE_OTHER): Payer: BLUE CROSS/BLUE SHIELD | Admitting: Licensed Clinical Social Worker

## 2018-08-06 DIAGNOSIS — F54 Psychological and behavioral factors associated with disorders or diseases classified elsewhere: Secondary | ICD-10-CM | POA: Diagnosis not present

## 2018-08-06 DIAGNOSIS — E1065 Type 1 diabetes mellitus with hyperglycemia: Principal | ICD-10-CM

## 2018-08-06 DIAGNOSIS — IMO0001 Reserved for inherently not codable concepts without codable children: Secondary | ICD-10-CM

## 2018-08-06 NOTE — Patient Instructions (Signed)
To remember to do your checks: - In the morning, do it first thing. Also set an alarm for about 20 min after waking up in case you forget - Always do a check before you bolus (can put a note on the fridge; sticky note on steering wheel)  - Remind yourself of why it would be helpful to do it (driver's license; feel better so can be active)

## 2018-08-14 ENCOUNTER — Encounter (INDEPENDENT_AMBULATORY_CARE_PROVIDER_SITE_OTHER): Payer: Self-pay | Admitting: Family

## 2018-08-14 ENCOUNTER — Ambulatory Visit (INDEPENDENT_AMBULATORY_CARE_PROVIDER_SITE_OTHER): Payer: BLUE CROSS/BLUE SHIELD | Admitting: Family

## 2018-08-14 VITALS — BP 118/60 | HR 104 | Ht 69.21 in | Wt 176.6 lb

## 2018-08-14 DIAGNOSIS — I10 Essential (primary) hypertension: Secondary | ICD-10-CM | POA: Diagnosis not present

## 2018-08-14 DIAGNOSIS — R739 Hyperglycemia, unspecified: Secondary | ICD-10-CM

## 2018-08-14 DIAGNOSIS — E1065 Type 1 diabetes mellitus with hyperglycemia: Secondary | ICD-10-CM

## 2018-08-14 DIAGNOSIS — R7309 Other abnormal glucose: Secondary | ICD-10-CM

## 2018-08-14 DIAGNOSIS — F54 Psychological and behavioral factors associated with disorders or diseases classified elsewhere: Secondary | ICD-10-CM | POA: Diagnosis not present

## 2018-08-14 DIAGNOSIS — IMO0001 Reserved for inherently not codable concepts without codable children: Secondary | ICD-10-CM

## 2018-08-14 LAB — POCT GLUCOSE (DEVICE FOR HOME USE): POC Glucose: 300 mg/dl — AB (ref 70–99)

## 2018-08-14 NOTE — Patient Instructions (Signed)
Basal Changes  8am: 1.30--> 1.35 8pm: 1.40 --> 1.45   Check blood sugar 4 x per day  - Rotate pump sites  - Bolus before eating   - Follow up in 2 months.

## 2018-08-14 NOTE — Progress Notes (Signed)
Pediatric Endocrinology Diabetes Consultation Follow-up Visit  [name] 08/15/1996 [id number]  Chief Complaint: Follow-up type 1 diabetes   Pudlo, Gennie Almaonald J, MD   HPI: [name]  is a 22 y.o. male presenting for follow-up of type 1 diabetes.   1. [name] was admitted to the pediatric intensive care unit of [facility] on 09/22/2006 for evaluation and treatment of new-onset type 1 diabetes mellitus, diabetic ketoacidosis, dehydration, and weight loss. He was started on Lantus as a basal insulin and Novolog aspart as a bolus insulin at mealtimes, bedtime, and 2 AM. He received additional diabetes education through our Diabetes Survival Skills Program and the [facility]. He was subsequently converted to an [employer] insulin pump.  2. Since last visit to [facility] on 05/2018, he has been well.    He quit his job two weeks ago and starts school next week. He is finishing his junior year of college. He has been trying harder with his diabetes care and has also been seeing a counselor which is helpful. He reports that he is checking his blood sugar more frequently and doing a better job bolusing. He had a site that went bad when he was playing basketball and he got ketones but was able to bring blood sugar down and clear ketones. He is not counting carbs consistently, usually guesses. He feels better overall.   Taking 10 mg of Lisinopril per day. He misses 1-2 days per week.    Insulin regimen: Medtronic 670g insulin pump  Basal Rates 12AM 1.20  4am 1.25  8am 1.30  2pm 1.25  8pm 1.40     Insulin to Carbohydrate Ratio 12AM 12     1030am 10     7pm 8    Insulin Sensitivity Factor 12AM 45  5am 40  1030am 30  7pm 35      Target Blood Glucose 12AM 150  6am 120  9pm 150           Hypoglycemia: Able to feel low blood sugars.  No glucagon needed recently.  Insulin pump download:   - Avg Bg 278. Checking 2 x per da   - Using 61 units per  day.   - 51% bolus and 49% basal   - Entering 200 grams of carbs per day.  Med-alert ID: Not currently wearing. Injection sites: back and buttocks.  Annual labs due: 05/2019  Ophthalmology due: 2018. Discussed importance with patient today.     3. ROS: Greater than 10 systems reviewed with pertinent positives listed in HPI, otherwise neg. Review of Systems  Constitutional: Negative.  Negative for malaise/fatigue and weight loss.  HENT: Negative.   Eyes: Negative.  Negative for blurred vision and double vision.       Wears glasses   Respiratory: Negative for cough and shortness of breath.   Cardiovascular: Negative for chest pain and palpitations.  Gastrointestinal: Negative for abdominal pain, constipation and diarrhea.  Genitourinary: Negative for frequency.  Musculoskeletal: Negative for neck pain.  Skin: Negative for itching and rash.  Neurological: Negative for tremors, sensory change, weakness and headaches.  Endo/Heme/Allergies: Negative for polydipsia.  Psychiatric/Behavioral: Negative for depression. The patient is not nervous/anxious.      Past Medical History:   Past Medical History:  Diagnosis Date  . Diabetes mellitus   . Goiter   . Goiter   . Hypoglycemia associated with diabetes (HCC)   . Hypoglycemia associated with diabetes (HCC)   . Physical growth delay   .  Thyroiditis, autoimmune     Medications:  Outpatient Encounter Medications as of 08/14/2018  Medication Sig  . glucagon (GLUCAGON EMERGENCY) 1 MG injection Inject 1 mg intramuscularly in thigh one time for severe hypoglycemia (Patient taking differently: Inject 1 mg into the muscle once as needed (for severe hypoglycemia). )  . glucose blood (BAYER CONTOUR NEXT TEST) test strip Check blood sugar 6-8 times per day  . insulin aspart (NOVOLOG) 100 UNIT/ML injection 300 UNITS IN INSULIN PUMP EVERY 48 HOURS  . Lancets (ACCU-CHEK MULTICLIX) lancets Use to test blood sugars 10 x daily  . lisinopril  (PRINIVIL,ZESTRIL) 10 MG tablet Take 1 tablet (10 mg total) by mouth daily.  Marland Kitchen oseltamivir (TAMIFLU) 75 MG capsule Take 1 capsule (75 mg total) by mouth 2 (two) times daily. (Patient not taking: Reported on 06/12/2018)   No facility-administered encounter medications on file as of 08/14/2018.     Allergies: No Known Allergies  Surgical History: Past Surgical History:  Procedure Laterality Date  . CIRCUMCISION      Family History:  Family History  Problem Relation Age of Onset  . Diabetes Paternal Grandmother   . Heart disease Paternal Grandmother   . Diabetes Paternal Grandfather   . Heart disease Paternal Grandfather   . Cancer Paternal Grandfather       Social History: Lives with: Roommate in college dorm at [employer].  Currently in junior year of college.    Physical Exam:  Vitals:   08/14/18 1115  BP: 118/60  Pulse: (!) 104  Weight: 176 lb 9.6 oz (80.1 kg)  Height: 5' 9.21" (1.758 m)   BP 118/60   Pulse (!) 104   Ht 5' 9.21" (1.758 m)   Wt 176 lb 9.6 oz (80.1 kg)   BMI 25.92 kg/m  Body mass index: body mass index is 25.92 kg/m. Growth percentile SmartLinks can only be used for patients less than 75 years old.  Ht Readings from Last 3 Encounters:  08/14/18 5' 9.21" (1.758 m)  06/12/18 5' 9.21" (1.758 m)  01/14/18 5\' 8"  (1.727 m)   Wt Readings from Last 3 Encounters:  08/14/18 176 lb 9.6 oz (80.1 kg)  06/12/18 169 lb (76.7 kg)  01/14/18 179 lb 12.8 oz (81.6 kg)    PHYSICAL EXAM:  General: Well developed, well nourished male in no acute distress.  He is alert and oriented.  Head: Normocephalic, atraumatic.   Eyes:  Pupils equal and round. EOMI.  Sclera white.  No eye drainage.   Ears/Nose/Mouth/Throat: Nares patent, no nasal drainage.  Normal dentition, mucous membranes moist.  Neck: supple, no cervical lymphadenopathy, no thyromegaly Cardiovascular: regular rate, normal S1/S2, no murmurs Respiratory: No increased work of breathing.  Lungs clear to  auscultation bilaterally.  No wheezes. Abdomen: soft, nontender, nondistended. Normal bowel sounds.  No appreciable masses  Extremities: warm, well perfused, cap refill < 2 sec.   Musculoskeletal: Normal muscle mass.  Normal strength Skin: warm, dry.  No rash or lesions. Neurologic: alert and oriented, normal speech, no tremor    Labs: Last hemoglobin A1c:  Lab Results  Component Value Date   HGBA1C 10.5 (A) 06/12/2018   Results for orders placed or performed in visit on 08/14/18  POCT Glucose (Device for Home Use)  Result Value Ref Range   Glucose Fasting, POC     POC Glucose 300 (A) 70 - 99 mg/dl    Assessment/Plan: [name] is a 22 y.o. male with type 1 diabetes in poor control on insulin pump therapy. Erik Spry  is checking blood sugar more and bolusing more but still not at goal. His blood sugars are hyperglycemic the majority of the day when he does check. He has started counseling to help improve his habits and feelings toward diabetes. Would benefit from using CGM therapy.   1. DM w/o complication type I, uncontrolled (HCC)/Hyperglycemia/elevated a1c  - Medtronic 670g insulin pump  - Encouraged to try CGM sensor.  - Rotate pump sites.  - Reviewed carb counting  - Check bg at least 4 x per day  - Wear medical alert ID at all times  - POCT glucose.    2. Essential hypertension - Take 10 mg of lisinopril per day   3. Maladaptive health behaviors affecting medical condition - Discussed barriers to care.  - Praise given for improvements.  - continue with behavioral health appointments.    4. Insulin pump titration.   Basal Rates 12AM 1.20  4am 1.25  8am 1.30--> 1.35  2pm 1.25  8pm 1.40 --1.45      Follow-up:   2 months.   I have spent >25 minutes with >50% of time in counseling, education and instruction. When a patient is on insulin, intensive monitoring of blood glucose levels is necessary to avoid hyperglycemia and hypoglycemia. Severe hyperglycemia/hypoglycemia can  lead to hospital admissions and be life threatening.  Marland Kitchen   Gretchen Short,  FNP-C  Pediatric Specialist  [address]  [place], [address]  Tele: [phone]

## 2018-08-17 ENCOUNTER — Institutional Professional Consult (permissible substitution) (INDEPENDENT_AMBULATORY_CARE_PROVIDER_SITE_OTHER): Payer: BLUE CROSS/BLUE SHIELD | Admitting: Licensed Clinical Social Worker

## 2018-08-20 NOTE — BH Specialist Note (Signed)
Integrated Behavioral Health Initial Visit  MRN: [id number] Name: [name]  Number of Integrated Behavioral Health Clinician visits:: 2/6 Session Start time: 10:52 AM  Session End time: 11:12 AM Total time: 20 minutes  Type of Service: Integrated Behavioral Health- Individual/Family Interpretor:No. Interpretor Name and Language: N/A   SUBJECTIVE: Erik Holean Felten is a 22 y.o. male accompanied by self Patient was referred by Gretchen ShortSpenser Beasley, NP for diabetes burnout. Patient reports the following symptoms/concerns: continuing to make improvements in diabetes care. Feels like he is usually remembering to bolus when eating and now remembering morning check. Wants to add at least one more BG check during the day.  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 Demonstrate ability to: Increase motivation to adhere to plan of care  INTERVENTIONS: Interventions utilized: Solution-Focused Strategies  Standardized Assessments completed: Not Needed  ASSESSMENT: Patient currently experiencing continuing improvements in adherence to diabetes care plan. Problem-solved how to add in one more BG check during the day. Currently, checking when waking up (around 10am), eating (around 12/1PM and 5/6pm) and going to bed (12/1AM).    Patient may benefit from improving diabetes care.  PLAN: 1. Follow up with behavioral health clinician on : 3-4 weeks 2. Behavioral recommendations:  1. Add in 8pm BG check. If with friends, ask them to hold you accountable when your alarm goes off. Remind  self it is a short break and you will feel better after 2. Contact pump manufacturer to replace broken clip  3. Referral(s): [facility] 4. "From scale of 1-10, how likely are you to follow plan?": likely  Mckinze Poirier E, LCSW

## 2018-08-25 ENCOUNTER — Ambulatory Visit (INDEPENDENT_AMBULATORY_CARE_PROVIDER_SITE_OTHER): Payer: BLUE CROSS/BLUE SHIELD | Admitting: Licensed Clinical Social Worker

## 2018-08-25 DIAGNOSIS — F54 Psychological and behavioral factors associated with disorders or diseases classified elsewhere: Secondary | ICD-10-CM | POA: Diagnosis not present

## 2018-08-25 DIAGNOSIS — IMO0001 Reserved for inherently not codable concepts without codable children: Secondary | ICD-10-CM

## 2018-08-25 DIAGNOSIS — E1065 Type 1 diabetes mellitus with hyperglycemia: Principal | ICD-10-CM

## 2018-08-25 NOTE — Patient Instructions (Addendum)
Continue checks when you wake, each time you eat, and before bed. Add a check around 8/8:30pm-- set an alarm on your phone/ pump  - If doing homework, remind yourself you can just take a quick break (only 5 minutes), check & then get back to work.  - If with your friends, ask them to hold you accountable when your alarm goes off  Contact pump manufacturer to replace the clip

## 2018-08-28 DIAGNOSIS — E109 Type 1 diabetes mellitus without complications: Secondary | ICD-10-CM | POA: Diagnosis not present

## 2018-08-28 DIAGNOSIS — E1065 Type 1 diabetes mellitus with hyperglycemia: Secondary | ICD-10-CM | POA: Diagnosis not present

## 2018-09-17 NOTE — BH Specialist Note (Signed)
Integrated Behavioral Health Follow Up Visit  MRN: [id number] Name: [name]  Number of Integrated Behavioral Health Clinician visits:: 3/6 Session Start time: 1:57 PM  Session End time: 2:27 PM Total time: 30 minutes  Type of Service: Integrated Behavioral Health- Individual/Family Interpretor:No. Interpretor Name and Language: N/A   SUBJECTIVE: [name] is a 22 y.o. male accompanied by self Patient was referred by Gretchen Short, NP for diabetes burnout. Patient reports the following symptoms/concerns: decline in diabetes care since last visit as he is very busy with school, work, and Teacher, adult education. Only doing maybe 1-2 BG checks per day. He is bolusing for food, but not correcting for sugars since not checking.   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 Demonstrate ability to: Increase motivation to adhere to plan of care  INTERVENTIONS: Interventions utilized: Mindfulness or Relaxation Training  Standardized Assessments completed: Not Needed  ASSESSMENT: Patient currently experiencing difficulty with diabetes care last few weeks as he has gotten busier with school, campus orgs, and new job (20 hrs/week). He is finding ways to organize and manage his time better now (uses Google calendar). Needing to restart doing his BG checks consistently. [facility] used MI to help Kasper identify his motivations and what has worked in the past.    Patient may benefit from improving diabetes care.  PLAN: 1. Follow up  with behavioral health clinician on : 10/23 joint with [name] 2. Behavioral recommendations:  1. Get back to checking your BG!   - Ask mom or friends to give you friendly reminders   - Add it to your google calendar   - Remind yourself why you shouldn't put it off    - Feel better    - Keep license    - Know where I'm at instead of guessing    - Prevent long-term issues    - Be a good role model or mentor 3. Referral(s): [facility] (In Clinic) 4. "From scale of 1-10, how likely are you to follow plan?": likely  Davonda Ausley E, LCSW

## 2018-09-24 ENCOUNTER — Encounter (INDEPENDENT_AMBULATORY_CARE_PROVIDER_SITE_OTHER): Payer: Self-pay | Admitting: Family

## 2018-09-24 ENCOUNTER — Ambulatory Visit (INDEPENDENT_AMBULATORY_CARE_PROVIDER_SITE_OTHER): Payer: BLUE CROSS/BLUE SHIELD | Admitting: Licensed Clinical Social Worker

## 2018-09-24 DIAGNOSIS — F54 Psychological and behavioral factors associated with disorders or diseases classified elsewhere: Secondary | ICD-10-CM

## 2018-09-24 DIAGNOSIS — IMO0001 Reserved for inherently not codable concepts without codable children: Secondary | ICD-10-CM

## 2018-09-24 DIAGNOSIS — E1065 Type 1 diabetes mellitus with hyperglycemia: Principal | ICD-10-CM

## 2018-09-24 NOTE — Patient Instructions (Signed)
Get back to checking your BG! - Ask mom or friends to give you friendly reminders - Add it to your google calendar - Remind yourself why you shouldn't put it off  - Feel better  - Keep license  - Know where I'm at instead of guessing  - Prevent long-term issues  - Be a good role model or mentor

## 2018-09-30 DIAGNOSIS — L308 Other specified dermatitis: Secondary | ICD-10-CM | POA: Diagnosis not present

## 2018-10-14 ENCOUNTER — Encounter (INDEPENDENT_AMBULATORY_CARE_PROVIDER_SITE_OTHER): Payer: Self-pay | Admitting: Licensed Clinical Social Worker

## 2018-10-14 ENCOUNTER — Ambulatory Visit (INDEPENDENT_AMBULATORY_CARE_PROVIDER_SITE_OTHER): Payer: Self-pay | Admitting: Family

## 2018-10-16 NOTE — BH Specialist Note (Signed)
Integrated Behavioral Health Follow Up Visit  MRN: [id number] Name: [name]  Number of Integrated Behavioral Health Clinician visits:: 4/6 Session Start time: 10:08 AM  Session End time: 10:28 AM Total time: 20 minutes  Type of Service: Integrated Behavioral Health- Individual/Family Interpretor:No. Interpretor Name and Language: N/A   SUBJECTIVE: [name] is a 22 y.o. male accompanied by self Patient was referred by Erik Short, NP for diabetes burnout. Patient reports the following symptoms/concerns: doing better with BG checks since last visit. Has been fitting it in more to his daily routine (when he wakes up, each time he eats, before bed) with just some missed checks. He is feeling it more when he is getting high, even to 200-220. Feels like he is balancing his time better. Feeling more motivated to take care of himself. 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 Demonstrate ability to: Increase motivation to adhere to plan of care  INTERVENTIONS: Interventions utilized: Motivational Interviewing  Standardized Assessments completed: Not Needed  ASSESSMENT: Patient currently experiencing improvement in care as noted above. [name] has been able to use a more helpful mindset to think about diabetes as just a part of his life that he will take care of and then move on to other things in his day. Motivated to prevent physical complications. Focused on recognizing improvements and not discounting them.      Patient may  benefit from improving diabetes care.  PLAN: 1. Follow up with behavioral health clinician on : joint with [name] 2. Behavioral recommendations: continue checking BG at wake & bed time and when you eat. Keep reminding yourself of your reasons and recognizing your progress.  3. Referral(s): [facility] (In Clinic) 4. "From scale of 1-10, how likely are you to follow plan?": likely  [name] E, LCSW

## 2018-10-22 ENCOUNTER — Encounter (INDEPENDENT_AMBULATORY_CARE_PROVIDER_SITE_OTHER): Payer: Self-pay | Admitting: Family

## 2018-10-22 ENCOUNTER — Ambulatory Visit (INDEPENDENT_AMBULATORY_CARE_PROVIDER_SITE_OTHER): Payer: BLUE CROSS/BLUE SHIELD | Admitting: Licensed Clinical Social Worker

## 2018-10-22 ENCOUNTER — Ambulatory Visit (INDEPENDENT_AMBULATORY_CARE_PROVIDER_SITE_OTHER): Payer: BLUE CROSS/BLUE SHIELD | Admitting: Family

## 2018-10-22 VITALS — BP 102/58 | HR 80 | Ht 69.09 in | Wt 176.6 lb

## 2018-10-22 DIAGNOSIS — F54 Psychological and behavioral factors associated with disorders or diseases classified elsewhere: Secondary | ICD-10-CM

## 2018-10-22 DIAGNOSIS — Z23 Encounter for immunization: Secondary | ICD-10-CM

## 2018-10-22 DIAGNOSIS — E1065 Type 1 diabetes mellitus with hyperglycemia: Secondary | ICD-10-CM | POA: Diagnosis not present

## 2018-10-22 DIAGNOSIS — R739 Hyperglycemia, unspecified: Secondary | ICD-10-CM | POA: Diagnosis not present

## 2018-10-22 DIAGNOSIS — Z4681 Encounter for fitting and adjustment of insulin pump: Secondary | ICD-10-CM

## 2018-10-22 DIAGNOSIS — IMO0001 Reserved for inherently not codable concepts without codable children: Secondary | ICD-10-CM

## 2018-10-22 DIAGNOSIS — R7309 Other abnormal glucose: Secondary | ICD-10-CM | POA: Diagnosis not present

## 2018-10-22 DIAGNOSIS — I10 Essential (primary) hypertension: Secondary | ICD-10-CM

## 2018-10-22 LAB — POCT GLYCOSYLATED HEMOGLOBIN (HGB A1C): Hemoglobin A1C: 10.3 % — AB (ref 4.0–5.6)

## 2018-10-22 LAB — POCT GLUCOSE (DEVICE FOR HOME USE): GLUCOSE FASTING, POC: 173 mg/dL — AB (ref 70–99)

## 2018-10-22 NOTE — Progress Notes (Signed)
Pediatric Endocrinology Diabetes Consultation Follow-up Visit  [name] January 26, 1996 [id number]  Chief Complaint: Follow-up type 1 diabetes   Patient, No Pcp Per   HPI: [name]  is a 22 y.o. male presenting for follow-up of type 1 diabetes.   1. [name] was admitted to the pediatric intensive care unit of [facility] on 09/22/2006 for evaluation and treatment of new-onset type 1 diabetes mellitus, diabetic ketoacidosis, dehydration, and weight loss. He was started on Lantus as a basal insulin and Novolog aspart as a bolus insulin at mealtimes, bedtime, and 2 AM. He received additional diabetes education through our Diabetes Survival Skills Program and the [facility]. He was subsequently converted to an [employer] insulin pump.  2. Since last visit to [facility] on 07/2018, he has been well.    Doing well in school and working at [employer] sports in his free time. He is working to make improvements to diabetes care and feels like he is doing a little bit better. He is using medtronic 670g insulin pump but does not want to wear the sensor. He has increased his blood sugar checks to 2-3 x per day most days. He is bolusing for most of his carb intake but usually boluses an hour after eating. Changing his site every 4-5 days. He has noticed that when he checks blood sugars more frequently, his blood sugars tend to be better.   Taking 10 mg of lisinopril per day. Misses 2 + days per week.   Insulin regimen: Medtronic 670g insulin pump  Basal Rates 12AM 1.20  4am 1.35  8am 1.30  2pm 1.35  8pm 1.40     Insulin to Carbohydrate Ratio 12AM 12     1030am 10     7pm 8    Insulin Sensitivity Factor 12AM 45  5am 40  1030am 30  7pm 35      Target Blood Glucose 12AM 150  6am 120  9pm 150           Hypoglycemia: Able to feel low blood sugars.  No glucagon needed recently.  Insulin pump download:   - Avg Bg 219. Checking 2.2 x per day   - Using 58  units per day   - 47% bolus and 53% basal   - Entering 202 grams of carbs per day on average.  Med-alert ID: Not currently wearing. Injection sites: back and buttocks.  Annual labs due: 05/2019  Ophthalmology due: 2018. Discussed importance with patient today.     3. ROS: Greater than 10 systems reviewed with pertinent positives listed in HPI, otherwise neg. Review of Systems  Constitutional: Negative.  Negative for malaise/fatigue and weight loss.  HENT: Negative.   Eyes: Negative.  Negative for blurred vision and double vision.       Wears glasses   Respiratory: Negative for cough and shortness of breath.   Cardiovascular: Negative for chest pain and palpitations.  Gastrointestinal: Negative for abdominal pain, constipation and diarrhea.  Genitourinary: Negative for frequency.  Musculoskeletal: Negative for neck pain.  Skin: Negative for itching and rash.  Neurological: Negative for tremors, sensory change, weakness and headaches.  Endo/Heme/Allergies: Negative for polydipsia.  Psychiatric/Behavioral: Negative for depression. The patient is not nervous/anxious.      Past Medical History:   Past Medical History:  Diagnosis Date  . Diabetes mellitus   . Goiter   . Goiter   . Hypoglycemia associated with diabetes (HCC)   . Hypoglycemia associated with diabetes (HCC)   .  Physical growth delay   . Thyroiditis, autoimmune     Medications:  Outpatient Encounter Medications as of 10/22/2018  Medication Sig  . glucagon (GLUCAGON EMERGENCY) 1 MG injection Inject 1 mg intramuscularly in thigh one time for severe hypoglycemia (Patient taking differently: Inject 1 mg into the muscle once as needed (for severe hypoglycemia). )  . glucose blood (BAYER CONTOUR NEXT TEST) test strip Check blood sugar 6-8 times per day  . insulin aspart (NOVOLOG) 100 UNIT/ML injection 300 UNITS IN INSULIN PUMP EVERY 48 HOURS  . Lancets (ACCU-CHEK MULTICLIX) lancets Use to test blood sugars 10 x daily  .  lisinopril (PRINIVIL,ZESTRIL) 10 MG tablet Take 1 tablet (10 mg total) by mouth daily.  Marland Kitchen oseltamivir (TAMIFLU) 75 MG capsule Take 1 capsule (75 mg total) by mouth 2 (two) times daily. (Patient not taking: Reported on 06/12/2018)   No facility-administered encounter medications on file as of 10/22/2018.     Allergies: No Known Allergies  Surgical History: Past Surgical History:  Procedure Laterality Date  . CIRCUMCISION      Family History:  Family History  Problem Relation Age of Onset  . Diabetes Paternal Grandmother   . Heart disease Paternal Grandmother   . Diabetes Paternal Grandfather   . Heart disease Paternal Grandfather   . Cancer Paternal Grandfather       Social History: Lives with: Parents. Commutes to school daily  Currently in junior year of college.    Physical Exam:  Vitals:   10/22/18 1004  BP: (!) 102/58  Pulse: 80  Weight: 176 lb 9.6 oz (80.1 kg)  Height: 5' 9.09" (1.755 m)   BP (!) 102/58   Pulse 80   Ht 5' 9.09" (1.755 m)   Wt 176 lb 9.6 oz (80.1 kg)   BMI 26.01 kg/m  Body mass index: body mass index is 26.01 kg/m. Growth percentile SmartLinks can only be used for patients less than 36 years old.  Ht Readings from Last 3 Encounters:  10/22/18 5' 9.09" (1.755 m)  08/14/18 5' 9.21" (1.758 m)  06/12/18 5' 9.21" (1.758 m)   Wt Readings from Last 3 Encounters:  10/22/18 176 lb 9.6 oz (80.1 kg)  08/14/18 176 lb 9.6 oz (80.1 kg)  06/12/18 169 lb (76.7 kg)    PHYSICAL EXAM:  General: Well developed, well nourished male in no acute distress.  He is alert and engaged during visit.  Head: Normocephalic, atraumatic.   Eyes:  Pupils equal and round. EOMI.  Sclera white.  No eye drainage.   Ears/Nose/Mouth/Throat: Nares patent, no nasal drainage.  Normal dentition, mucous membranes moist.  Neck: supple, no cervical lymphadenopathy, no thyromegaly Cardiovascular: regular rate, normal S1/S2, no murmurs Respiratory: No increased work of  breathing.  Lungs clear to auscultation bilaterally.  No wheezes. Abdomen: soft, nontender, nondistended. Normal bowel sounds.  No appreciable masses  Extremities: warm, well perfused, cap refill < 2 sec.   Musculoskeletal: Normal muscle mass.  Normal strength Skin: warm, dry.  No rash or lesions. Insulin pump site prsent.  Neurologic: alert and oriented, normal speech, no tremor     Labs: Last hemoglobin A1c: 10.5% on 05/2018 Lab Results  Component Value Date   HGBA1C 10.3 (A) 10/22/2018   Results for orders placed or performed in visit on 10/22/18  POCT Glucose (Device for Home Use)  Result Value Ref Range   Glucose Fasting, POC 173 (A) 70 - 99 mg/dL   POC Glucose    POCT glycosylated hemoglobin (Hb A1C)  Result Value Ref Range   Hemoglobin A1C 10.3 (A) 4.0 - 5.6 %   HbA1c POC (<> result, manual entry)     HbA1c, POC (prediabetic range)     HbA1c, POC (controlled diabetic range)      Assessment/Plan: [name] is a 22 y.o. male with type 1 diabetes in poor control on insulin pump therapy. Working to make improvements but still not checking blood sugar and bolusing consistently. He needs a stronger carb raito during the day. His hemoglobin A1c is 10.3% which is higher then the ADA goal of <7% for adults.   1. DM w/o complication type I, uncontrolled (HCC)/Hyperglycemia/elevated a1c  - Medtronic insulin pump  - Reviewed pump download and discussed with patient.  - Check bg at least 4 x per day  - Bolus 15 minutes before eating to limit blood sugar spikes.  - Rotate pump sites.  - POCT glucose and hemoglobin A1c  - Reviewed growth chart. .    2. Essential hypertension - 10 mg of Lisinopril per day.   3. Maladaptive health behaviors affecting medical condition - Discussed barriers to care.  - Encouraged to continue to see behavioral health.  - Discussed balancing diabetes with school, social and activities.  - Answered questions.    4. Insulin pump titration.   Insulin  to Carbohydrate Ratio 12AM 12  7am 10--> 9  7pm 8          Follow-up:   2 months.   I have spent >40 minutes with >50% of time in counseling, education and instruction. When a patient is on insulin, intensive monitoring of blood glucose levels is necessary to avoid hyperglycemia and hypoglycemia. Severe hyperglycemia/hypoglycemia can lead to hospital admissions and be life threatening.     Gretchen Short,  FNP-C  Pediatric Specialist  [address]  [place], [address]  Tele: [phone]

## 2018-10-22 NOTE — Patient Instructions (Signed)
-   Carb Ratio   - 12am: 12  - 8am: 10--> 9    - Check bg 4 x per day  - Rotate site every 3 days  - A1c is 10.3%   - Follow up in 3 months.

## 2018-11-20 DIAGNOSIS — E1065 Type 1 diabetes mellitus with hyperglycemia: Secondary | ICD-10-CM | POA: Diagnosis not present

## 2018-11-20 DIAGNOSIS — E109 Type 1 diabetes mellitus without complications: Secondary | ICD-10-CM | POA: Diagnosis not present

## 2018-11-27 DIAGNOSIS — E109 Type 1 diabetes mellitus without complications: Secondary | ICD-10-CM | POA: Diagnosis not present

## 2018-11-27 DIAGNOSIS — E1065 Type 1 diabetes mellitus with hyperglycemia: Secondary | ICD-10-CM | POA: Diagnosis not present

## 2018-12-24 ENCOUNTER — Encounter (INDEPENDENT_AMBULATORY_CARE_PROVIDER_SITE_OTHER): Payer: Self-pay | Admitting: Licensed Clinical Social Worker

## 2018-12-24 ENCOUNTER — Ambulatory Visit (INDEPENDENT_AMBULATORY_CARE_PROVIDER_SITE_OTHER): Payer: Self-pay | Admitting: Family

## 2019-01-08 NOTE — BH Specialist Note (Signed)
Integrated Behavioral Health Follow Up Visit  MRN: [id number] Name: [name]  Number of Integrated Behavioral Health Clinician visits:: 5/6 Session Start time: 12:00 PM  Session End time: 12:26 PM Total time: 26 minutes  Type of Service: Integrated Behavioral Health- Individual/Family Interpretor:No. Interpretor Name and Language: N/A   SUBJECTIVE: [name] is a 23 y.o. male accompanied by self Patient was referred by Gretchen Short, NP for diabetes burnout. Patient reports the following symptoms/concerns: has not been doing diabetes care regularly and A1C is very elevated. Only doing BG check about 1x/day and guessing on boluses. He also was not working out over break and just "took time off" from everything. Flatter affect today. Is back at school and work.  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 Demonstrate ability to: Increase motivation to adhere to plan of care  INTERVENTIONS: Interventions utilized: Motivational Interviewing  Standardized Assessments completed: Not Needed  ASSESSMENT: Patient currently experiencing struggles with care as noted above. Used MI to figure out Mycheal's goals (working out/ [employer]) and how to link this to diabetes care. Kyrus appeared more disengaged than usual today. Denied any other stressors or issues currently.       Patient may benefit from improving diabetes care.  PLAN: 1. Follow up with behavioral health clinician on : joint with [name] 2. Behavioral recommendations:  Put meter on desk so it is visible. Check when you wake up & before working out. Then add in more checks with each meal.  3. Referral(s): Integrated [facility] (In Clinic) 4. "From scale of 1-10, how likely are you to follow plan?": likely  STOISITS, MICHELLE E, LCSW

## 2019-01-11 ENCOUNTER — Other Ambulatory Visit (INDEPENDENT_AMBULATORY_CARE_PROVIDER_SITE_OTHER): Payer: Self-pay | Admitting: Family

## 2019-01-11 DIAGNOSIS — E1065 Type 1 diabetes mellitus with hyperglycemia: Principal | ICD-10-CM

## 2019-01-11 DIAGNOSIS — IMO0001 Reserved for inherently not codable concepts without codable children: Secondary | ICD-10-CM

## 2019-01-14 ENCOUNTER — Ambulatory Visit (INDEPENDENT_AMBULATORY_CARE_PROVIDER_SITE_OTHER): Payer: BLUE CROSS/BLUE SHIELD | Admitting: Licensed Clinical Social Worker

## 2019-01-14 ENCOUNTER — Ambulatory Visit (INDEPENDENT_AMBULATORY_CARE_PROVIDER_SITE_OTHER): Payer: BLUE CROSS/BLUE SHIELD | Admitting: Family

## 2019-01-14 ENCOUNTER — Encounter (INDEPENDENT_AMBULATORY_CARE_PROVIDER_SITE_OTHER): Payer: Self-pay | Admitting: Family

## 2019-01-14 VITALS — BP 118/76 | HR 68 | Ht 68.9 in | Wt 182.2 lb

## 2019-01-14 DIAGNOSIS — R739 Hyperglycemia, unspecified: Secondary | ICD-10-CM | POA: Diagnosis not present

## 2019-01-14 DIAGNOSIS — E1065 Type 1 diabetes mellitus with hyperglycemia: Secondary | ICD-10-CM | POA: Diagnosis not present

## 2019-01-14 DIAGNOSIS — F54 Psychological and behavioral factors associated with disorders or diseases classified elsewhere: Secondary | ICD-10-CM

## 2019-01-14 DIAGNOSIS — R7309 Other abnormal glucose: Secondary | ICD-10-CM | POA: Diagnosis not present

## 2019-01-14 DIAGNOSIS — I10 Essential (primary) hypertension: Secondary | ICD-10-CM

## 2019-01-14 DIAGNOSIS — Z91199 Patient's noncompliance with other medical treatment and regimen due to unspecified reason: Secondary | ICD-10-CM

## 2019-01-14 DIAGNOSIS — IMO0001 Reserved for inherently not codable concepts without codable children: Secondary | ICD-10-CM

## 2019-01-14 DIAGNOSIS — Z9119 Patient's noncompliance with other medical treatment and regimen: Secondary | ICD-10-CM

## 2019-01-14 LAB — POCT GLYCOSYLATED HEMOGLOBIN (HGB A1C)

## 2019-01-14 LAB — POCT GLUCOSE (DEVICE FOR HOME USE): GLUCOSE FASTING, POC: 311 mg/dL — AB (ref 70–99)

## 2019-01-14 NOTE — Patient Instructions (Signed)
-  Always have fast sugar with you in case of low blood sugar (glucose tabs, regular juice or soda, candy) -Always wear your ID that states you have diabetes -Always bring your meter to your visit -Call/Email if you want to review blood sugars   

## 2019-01-14 NOTE — Progress Notes (Signed)
Pediatric Endocrinology Diabetes Consultation Follow-up Visit  [name] 11/06/1996 [id number]  Chief Complaint: Follow-up type 1 diabetes   Patient, No Pcp Per   HPI: [name]  is a 23 y.o. male presenting for follow-up of type 1 diabetes.   1. [name] was admitted to the pediatric intensive care unit of [facility] on 09/22/2006 for evaluation and treatment of new-onset type 1 diabetes mellitus, diabetic ketoacidosis, dehydration, and weight loss. He was started on Lantus as a basal insulin and Novolog aspart as a bolus insulin at mealtimes, bedtime, and 2 AM. He received additional diabetes education through our Diabetes Survival Skills Program and the [facility]. He was subsequently converted to an [employer] insulin pump.  2. Since last visit to [facility] on 07/2018, he has been well.   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 Using Medtronic insulin pump but is not using CGM currently. Reports that diabetes care is going ok. He feels like his blood sugars have been hyperglycemia more. Trying to develop a routine. His biggest struggle is that he is not testing blood sugar enough. He thinks his hemoglobin A1c is going to be very high today. Just began "trying" and bolusing again over the past month.    Taking 10 mg of lisinopril per day.   Insulin regimen: Medtronic 670g insulin pump  Basal Rates 12AM 1.20  4am 1.35  8am 1.30  2pm 1.35  8pm 1.40     Insulin to Carbohydrate Ratio 12AM 12     1030am 10     7pm 8    Insulin Sensitivity Factor 12AM 45  5am 40  1030am 30  7pm 35      Target Blood Glucose 12AM 150  6am 120  9pm 150           Hypoglycemia: Able to feel low blood sugars.  No glucagon needed recently.  Insulin pump download:   - Avg Bg 295. Checking 1.8 x per day.  - Using 68 units per day.   - 54% bolus and 46% basal   - Entering 272 grams of carbs.  Med-alert ID: Not currently  wearing. Injection sites: back and buttocks.  Annual labs due: 05/2019  Ophthalmology due: 2019. Discussed importance with patient today.     3. ROS: Greater than 10 systems reviewed with pertinent positives listed in HPI, otherwise neg. Review of Systems  Constitutional: Negative.  Negative for malaise/fatigue and weight loss.  HENT: Negative.   Eyes: Negative.  Negative for blurred vision and double vision.       Wears glasses   Respiratory: Negative for cough and shortness of breath.   Cardiovascular: Negative for chest pain and palpitations.  Gastrointestinal: Negative for abdominal pain, constipation and diarrhea.  Genitourinary: Negative for frequency.  Musculoskeletal: Negative for neck pain.  Skin: Negative for itching and rash.  Neurological: Negative for tremors, sensory change, weakness and headaches.  Endo/Heme/Allergies: Negative for polydipsia.  Psychiatric/Behavioral: Negative for depression. The patient is not nervous/anxious.      Past Medical History:   Past Medical History:  Diagnosis Date  . Diabetes mellitus   . Goiter   . Goiter   . Hypoglycemia associated with diabetes (HCC)   . Hypoglycemia associated with diabetes (HCC)   . Physical growth delay   . Thyroiditis, autoimmune     Medications:  Outpatient Encounter Medications as of 01/14/2019  Medication Sig  . glucagon (GLUCAGON EMERGENCY) 1 MG injection Inject  1 mg intramuscularly in thigh one time for severe hypoglycemia (Patient taking differently: Inject 1 mg into the muscle once as needed (for severe hypoglycemia). )  . glucose blood (BAYER CONTOUR NEXT TEST) test strip Check blood sugar 6-8 times per day  . insulin aspart (NOVOLOG) 100 UNIT/ML injection INJECT 300 UNITS EVERY 48 HOURS VIA INSULIN PUMP  . Lancets (ACCU-CHEK MULTICLIX) lancets Use to test blood sugars 10 x daily  . lisinopril (PRINIVIL,ZESTRIL) 10 MG tablet Take 1 tablet (10 mg total) by mouth daily.  Marland Kitchen oseltamivir (TAMIFLU) 75 MG  capsule Take 1 capsule (75 mg total) by mouth 2 (two) times daily. (Patient not taking: Reported on 06/12/2018)   No facility-administered encounter medications on file as of 01/14/2019.     Allergies: No Known Allergies  Surgical History: Past Surgical History:  Procedure Laterality Date  . CIRCUMCISION      Family History:  Family History  Problem Relation Age of Onset  . Diabetes Paternal Grandmother   . Heart disease Paternal Grandmother   . Diabetes Paternal Grandfather   . Heart disease Paternal Grandfather   . Cancer Paternal Grandfather       Social History: Lives with: Parents. Commutes to school daily  Currently in junior year of college.    Physical Exam:  Vitals:   01/14/19 1128  BP: 118/76  Pulse: 68  Weight: 182 lb 3.2 oz (82.6 kg)  Height: 5' 8.9" (1.75 m)   BP 118/76   Pulse 68   Ht 5' 8.9" (1.75 m)   Wt 182 lb 3.2 oz (82.6 kg)   BMI 26.99 kg/m  Body mass index: body mass index is 26.99 kg/m. Growth percentile SmartLinks can only be used for patients less than 44 years old.  Ht Readings from Last 3 Encounters:  01/14/19 5' 8.9" (1.75 m)  10/22/18 5' 9.09" (1.755 m)  08/14/18 5' 9.21" (1.758 m)   Wt Readings from Last 3 Encounters:  01/14/19 182 lb 3.2 oz (82.6 kg)  10/22/18 176 lb 9.6 oz (80.1 kg)  08/14/18 176 lb 9.6 oz (80.1 kg)    PHYSICAL EXAM:  General: Well developed, well nourished male in no acute distress.  Alert and oriented.  Head: Normocephalic, atraumatic.   Eyes:  Pupils equal and round. EOMI.  Sclera white.  No eye drainage.   Ears/Nose/Mouth/Throat: Nares patent, no nasal drainage.  Normal dentition, mucous membranes moist.  Neck: supple, no cervical lymphadenopathy, no thyromegaly Cardiovascular: regular rate, normal S1/S2, no murmurs Respiratory: No increased work of breathing.  Lungs clear to auscultation bilaterally.  No wheezes. Abdomen: soft, nontender, nondistended. Normal bowel sounds.  No appreciable masses   Extremities: warm, well perfused, cap refill < 2 sec.   Musculoskeletal: Normal muscle mass.  Normal strength Skin: warm, dry.  No rash or lesions. + pump site.  Neurologic: alert and oriented, normal speech, no tremor      Labs: Last hemoglobin A1c: 10.2% on 09/2018 Lab Results  Component Value Date   HGBA1C >14.0 01/14/2019   Results for orders placed or performed in visit on 01/14/19  POCT Glucose (Device for Home Use)  Result Value Ref Range   Glucose Fasting, POC 311 (A) 70 - 99 mg/dL   POC Glucose    POCT glycosylated hemoglobin (Hb A1C)  Result Value Ref Range   Hemoglobin A1C     HbA1c POC (<> result, manual entry) >14.0 4.0 - 5.6 %   HbA1c, POC (prediabetic range)     HbA1c, POC (controlled  diabetic range)      Assessment/Plan: [name] is a 23 y.o. male with type 1 diabetes in very poor and worsening control on insulin pump therapy. He has not been checking blood sugars frequently enough and is skipping boluses frequently. He has inadequate data on his insulin pump. However, his hemoglobin A1c is >14% today which is much higher then the ADA goal of <7% for adults.    1-3. DM w/o complication type I, uncontrolled (HCC)/Hyperglycemia/elevated a1c  - Reviewed insulin pump and glucose download. Discussed patterns and trends.  - Advised to bolus 15 minutes before eating  - Check bg at least 4 x per day  - Rotate pump sites to prevent scar tissue.  - Wear medical alert ID  - Discussed using temp basals   - increase during stress and sicknes.   - Decrease during activity.  - POCT glucose and hemoglobin A1c   4. Essential hypertension - 10 mg of Lisinopril per day.   5. Maladaptive health behaviors affecting medical condition 6. Noncompliance  - Discussed barriers to care  - Discussed possible complication of uncontrolled type 1 diabetes  - Answered questions.      Follow-up:     I have spent >40 minutes with >50% of time in counseling, education and  instruction. When a patient is on insulin, intensive monitoring of blood glucose levels is necessary to avoid hyperglycemia and hypoglycemia. Severe hyperglycemia/hypoglycemia can lead to hospital admissions and be life threatening.      Gretchen ShortSpenser Filippa Yarbough,  FNP-C  Pediatric Specialist  [address]  [place], [id number]  Tele: [phone]

## 2019-01-20 ENCOUNTER — Ambulatory Visit (INDEPENDENT_AMBULATORY_CARE_PROVIDER_SITE_OTHER): Payer: Self-pay | Admitting: Licensed Clinical Social Worker

## 2019-01-20 ENCOUNTER — Ambulatory Visit (INDEPENDENT_AMBULATORY_CARE_PROVIDER_SITE_OTHER): Payer: Self-pay | Admitting: Family

## 2019-02-15 ENCOUNTER — Ambulatory Visit (INDEPENDENT_AMBULATORY_CARE_PROVIDER_SITE_OTHER): Payer: BLUE CROSS/BLUE SHIELD | Admitting: Family

## 2019-02-15 ENCOUNTER — Encounter (INDEPENDENT_AMBULATORY_CARE_PROVIDER_SITE_OTHER): Payer: Self-pay | Admitting: Licensed Clinical Social Worker

## 2019-02-15 ENCOUNTER — Encounter (INDEPENDENT_AMBULATORY_CARE_PROVIDER_SITE_OTHER): Payer: Self-pay | Admitting: Family

## 2019-02-15 VITALS — BP 116/68 | HR 84 | Ht 69.17 in | Wt 183.0 lb

## 2019-02-15 DIAGNOSIS — F54 Psychological and behavioral factors associated with disorders or diseases classified elsewhere: Secondary | ICD-10-CM | POA: Diagnosis not present

## 2019-02-15 DIAGNOSIS — E1065 Type 1 diabetes mellitus with hyperglycemia: Secondary | ICD-10-CM | POA: Diagnosis not present

## 2019-02-15 DIAGNOSIS — R739 Hyperglycemia, unspecified: Secondary | ICD-10-CM

## 2019-02-15 DIAGNOSIS — IMO0001 Reserved for inherently not codable concepts without codable children: Secondary | ICD-10-CM

## 2019-02-15 DIAGNOSIS — I10 Essential (primary) hypertension: Secondary | ICD-10-CM | POA: Diagnosis not present

## 2019-02-15 DIAGNOSIS — Z4681 Encounter for fitting and adjustment of insulin pump: Secondary | ICD-10-CM

## 2019-02-15 DIAGNOSIS — Z91199 Patient's noncompliance with other medical treatment and regimen due to unspecified reason: Secondary | ICD-10-CM

## 2019-02-15 DIAGNOSIS — Z9119 Patient's noncompliance with other medical treatment and regimen: Secondary | ICD-10-CM

## 2019-02-15 LAB — POCT GLUCOSE (DEVICE FOR HOME USE): GLUCOSE FASTING, POC: 223 mg/dL — AB (ref 70–99)

## 2019-02-15 NOTE — Progress Notes (Signed)
Pediatric Endocrinology Diabetes Consultation Follow-up Visit  [name] [birth year] [id number]  Chief Complaint: Follow-up type 1 diabetes   Patient, No Pcp Per   HPI: [name]  is a 23 y.o. male presenting for follow-up of type 1 diabetes.   1. [name] was admitted to the pediatric intensive care unit of [facility] on 09/22/2006 for evaluation and treatment of new-onset type 1 diabetes mellitus, diabetic ketoacidosis, dehydration, and weight loss. He was started on Lantus as a basal insulin and Novolog aspart as a bolus insulin at mealtimes, bedtime, and 2 AM. He received additional diabetes education through our Diabetes Survival Skills Program and the [facility]. He was subsequently converted to an [employer] insulin pump.  2. Since last visit to [facility] on 12/2018, he has been well.   He has been busy with school and work but doing pretty good overall. He volunteered at the [employer]. Using medtronic 670g insulin pump but is not wearing CGM. He feels like he is checking more and bolusing more. He mainly estimates his dose but feels like he is pretty close. Thinks his blood sugars have been better for the most part, has occasional "extreme" highs when he gets a big meal.   Taking 10 mg of lisinopril per day.   Insulin regimen: Medtronic 670g insulin pump  Basal Rates 12AM 1.20  4am 1.25  8am 1.35  2pm 1.25  8pm 1.45    Insulin to Carbohydrate Ratio 12AM 12  7am 9  5pm 8          Insulin Sensitivity Factor 12AM 45  5am 40  1030am 30  7pm 35      Target Blood Glucose 12AM 150  6am 120  9pm 150           Hypoglycemia: Able to feel low blood sugars.  No glucagon needed recently.  Insulin pump download:   - Avg 244. Checking 2.8 x per day   - Using 66 units per   - 53% bolus and 47% basal   - Entering 244 units per day.   Med-alert ID: Not currently wearing. Injection sites: back and buttocks.  Annual labs due: 05/2019   Ophthalmology due: 2019. Discussed importance with patient today.     3. ROS: Greater than 10 systems reviewed with pertinent positives listed in HPI, otherwise neg. Review of Systems  Constitutional: Negative.  Negative for malaise/fatigue and weight loss.  HENT: Negative.   Eyes: Negative.  Negative for blurred vision and double vision.       Wears glasses   Respiratory: Negative for cough and shortness of breath.   Cardiovascular: Negative for chest pain and palpitations.  Gastrointestinal: Negative for abdominal pain, constipation and diarrhea.  Genitourinary: Negative for frequency.  Musculoskeletal: Negative for neck pain.  Skin: Negative for itching and rash.  Neurological: Negative for tremors, sensory change, weakness and headaches.  Endo/Heme/Allergies: Negative for polydipsia.  Psychiatric/Behavioral: Negative for depression. The patient is not nervous/anxious.      Past Medical History:   Past Medical History:  Diagnosis Date  . Diabetes mellitus   . Goiter   . Goiter   . Hypoglycemia associated with diabetes (HCC)   . Hypoglycemia associated with diabetes (HCC)   . Physical growth delay   . Thyroiditis, autoimmune     Medications:  Outpatient Encounter Medications as of 02/15/2019  Medication Sig  . glucagon (GLUCAGON EMERGENCY) 1 MG injection Inject 1 mg intramuscularly in thigh one time for  severe hypoglycemia (Patient taking differently: Inject 1 mg into the muscle once as needed (for severe hypoglycemia). )  . glucose blood (BAYER CONTOUR NEXT TEST) test strip Check blood sugar 6-8 times per day  . insulin aspart (NOVOLOG) 100 UNIT/ML injection INJECT 300 UNITS EVERY 48 HOURS VIA INSULIN PUMP  . Lancets (ACCU-CHEK MULTICLIX) lancets Use to test blood sugars 10 x daily  . lisinopril (PRINIVIL,ZESTRIL) 10 MG tablet Take 1 tablet (10 mg total) by mouth daily.  Marland Kitchen oseltamivir (TAMIFLU) 75 MG capsule Take 1 capsule (75 mg total) by mouth 2 (two) times daily.  (Patient not taking: Reported on 06/12/2018)   No facility-administered encounter medications on file as of 02/15/2019.     Allergies: No Known Allergies  Surgical History: Past Surgical History:  Procedure Laterality Date  . CIRCUMCISION      Family History:  Family History  Problem Relation Age of Onset  . Diabetes Paternal Grandmother   . Heart disease Paternal Grandmother   . Diabetes Paternal Grandfather   . Heart disease Paternal Grandfather   . Cancer Paternal Grandfather       Social History: Lives with: Parents. Commutes to school daily  Currently in junior year of college.    Physical Exam:  Vitals:   02/15/19 0919  BP: 116/68  Pulse: 84  Weight: 183 lb (83 kg)  Height: 5' 9.17" (1.757 m)   BP 116/68   Pulse 84   Ht 5' 9.17" (1.757 m)   Wt 183 lb (83 kg)   BMI 26.89 kg/m  Body mass index: body mass index is 26.89 kg/m. Growth percentile SmartLinks can only be used for patients less than 71 years old.  Ht Readings from Last 3 Encounters:  02/15/19 5' 9.17" (1.757 m)  01/14/19 5' 8.9" (1.75 m)  10/22/18 5' 9.09" (1.755 m)   Wt Readings from Last 3 Encounters:  02/15/19 183 lb (83 kg)  01/14/19 182 lb 3.2 oz (82.6 kg)  10/22/18 176 lb 9.6 oz (80.1 kg)    PHYSICAL EXAM:  General: Well developed, well nourished male in no acute distress.  Alert and oriented.  Head: Normocephalic, atraumatic.   Eyes:  Pupils equal and round. EOMI.  Sclera white.  No eye drainage.   Ears/Nose/Mouth/Throat: Nares patent, no nasal drainage.  Normal dentition, mucous membranes moist.  Neck: supple, no cervical lymphadenopathy, no thyromegaly Cardiovascular: regular rate, normal S1/S2, no murmurs Respiratory: No increased work of breathing.  Lungs clear to auscultation bilaterally.  No wheezes. Abdomen: soft, nontender, nondistended. Normal bowel sounds.  No appreciable masses  Extremities: warm, well perfused, cap refill < 2 sec.   Musculoskeletal: Normal muscle  mass.  Normal strength Skin: warm, dry.  No rash or lesions. + pump site.  Neurologic: alert and oriented, normal speech, no tremor       Labs: Last hemoglobin A1c: >14 on 12/2018  Lab Results  Component Value Date   HGBA1C >14.0 01/14/2019   Results for orders placed or performed in visit on 02/15/19  POCT Glucose (Device for Home Use)  Result Value Ref Range   Glucose Fasting, POC 223 (A) 70 - 99 mg/dL   POC Glucose      Assessment/Plan: [name] is a 23 y.o. male with type 1 diabetes in poor control on insulin pump therapy. He would benefit from CGm therapy but is resistant at this time. Checking blood sugar more frequently but still less then desired. Blood sugars are primarily hyperglycemic, will increase basal rates. Encouraged to bolus  for all carbs.   1-3. DM w/o complication type I, uncontrolled (HCC)/Hyperglycemia/elevated a1c  - Reviewed insulin pump download. Discussed trends and patterns.  - Bolus 15 minutes before eating to limit blood sugar psikes.  - Use temp basals on insulin pump. Increase during stress and sickness. Decrease during activity  - Rotate pump sites to prevent scar tissue.  - Encouraged to consider using a CGM. If not, check bg at least 4 x per day  - Reviewed growth chart.   4. Essential hypertension - 10 mg of Lisinopril per day.   5. Maladaptive health behaviors affecting medical condition 6. Noncompliance  - Discussed barriers to care.  - Answered questions - Encouraged behavioral health follow up.   7. Insulin pump titration  Basal Rates 12AM 1.20--> 1.25   4am 1.25--. 1.30   8am 1.35--> 1.45  2pm 1.25  8pm 1.45     Follow-up:   6 weeks.   LOS: >25 minutes with >50% of time in counseling, education and instruction. When a patient is on insulin, intensive monitoring of blood glucose levels is necessary to avoid hyperglycemia and hypoglycemia. Severe hyperglycemia/hypoglycemia can lead to hospital admissions and be life threatening.        Gretchen Short,  FNP-C  Pediatric Specialist  [address]  [place], [address]  Tele: [phone]

## 2019-02-15 NOTE — Patient Instructions (Signed)
-  Always have fast sugar with you in case of low blood sugar (glucose tabs, regular juice or soda, candy) -Always wear your ID that states you have diabetes -Always bring your meter to your visit -Call/Email if you want to review blood sugars  - Consider Dexcom G6

## 2019-02-15 NOTE — BH Specialist Note (Signed)
Integrated Behavioral Health Follow Up Visit  MRN: [id number] Name: [name]  Number of Integrated Behavioral Health Clinician visits:: 6/6 Session Start time: 2:08 PM  Session End time: 2:37 PM Total time: 29 minutes  Type of Service: Integrated Behavioral Health- Individual/Family Interpretor:No. Interpretor Name and Language: N/A   SUBJECTIVE: [name] is a 23 y.o. male accompanied by self Patient was referred by Gretchen Short, NP for diabetes burnout. Patient reports the following symptoms/concerns: some improvement in diabetes care since last visit. Has been doing a couple of BG checks a day (mainly before and after the gym) and some more bolusing with food. Starting to be aware of excessive carb intake and slowly reducing the number of carbs. Considering new CGM (Dexcom G6) since insertion device is updated. Thinking of his motivations like keeping his license and keeping [name] as his provider.  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 Demonstrate ability to: Increase motivation to adhere to plan of care  INTERVENTIONS: Interventions utilized: Motivational Interviewing  Standardized Assessments completed: Not Needed  ASSESSMENT: Patient currently experiencing slowly improving his care. Discussed ways to keep his motivations in mind and ways to continue small, achievable, and sustainable steps towards better care. He has done some more looking into the Dexcom G6 and is more open to it now, but not fully willing to  commit yet.      Patient may benefit from improving diabetes care.  PLAN: 1. Follow up with behavioral health clinician on : joint with [name] 4/6 2. Behavioral recommendations: Continue increasing checks (before & after gym, before eating, before bed). Monitor carbs with small decreases when they are "excessive" to where you know you will stay high after. Write your motivations (license, prove it to yourself, keep [employer]) on Print production planner. Keep looking into CGMs.  3. Referral(s): Integrated [employer] (In Clinic) 4. "From scale of 1-10, how likely are you to follow plan?": likely  [name] E, LCSW

## 2019-02-16 DIAGNOSIS — E1065 Type 1 diabetes mellitus with hyperglycemia: Secondary | ICD-10-CM | POA: Diagnosis not present

## 2019-02-16 DIAGNOSIS — E109 Type 1 diabetes mellitus without complications: Secondary | ICD-10-CM | POA: Diagnosis not present

## 2019-02-18 ENCOUNTER — Ambulatory Visit (INDEPENDENT_AMBULATORY_CARE_PROVIDER_SITE_OTHER): Payer: BLUE CROSS/BLUE SHIELD | Admitting: Licensed Clinical Social Worker

## 2019-02-18 DIAGNOSIS — F54 Psychological and behavioral factors associated with disorders or diseases classified elsewhere: Secondary | ICD-10-CM | POA: Diagnosis not present

## 2019-02-18 DIAGNOSIS — E1065 Type 1 diabetes mellitus with hyperglycemia: Secondary | ICD-10-CM

## 2019-02-18 DIAGNOSIS — E109 Type 1 diabetes mellitus without complications: Secondary | ICD-10-CM | POA: Diagnosis not present

## 2019-02-18 DIAGNOSIS — IMO0001 Reserved for inherently not codable concepts without codable children: Secondary | ICD-10-CM

## 2019-03-29 ENCOUNTER — Encounter (INDEPENDENT_AMBULATORY_CARE_PROVIDER_SITE_OTHER): Payer: Self-pay | Admitting: Family

## 2019-03-29 ENCOUNTER — Other Ambulatory Visit: Payer: Self-pay

## 2019-03-29 ENCOUNTER — Ambulatory Visit (INDEPENDENT_AMBULATORY_CARE_PROVIDER_SITE_OTHER): Payer: BLUE CROSS/BLUE SHIELD | Admitting: Family

## 2019-03-29 ENCOUNTER — Ambulatory Visit (INDEPENDENT_AMBULATORY_CARE_PROVIDER_SITE_OTHER): Payer: Self-pay | Admitting: Licensed Clinical Social Worker

## 2019-03-29 DIAGNOSIS — F54 Psychological and behavioral factors associated with disorders or diseases classified elsewhere: Secondary | ICD-10-CM

## 2019-03-29 DIAGNOSIS — E1065 Type 1 diabetes mellitus with hyperglycemia: Secondary | ICD-10-CM | POA: Diagnosis not present

## 2019-03-29 DIAGNOSIS — I10 Essential (primary) hypertension: Secondary | ICD-10-CM

## 2019-03-29 DIAGNOSIS — R739 Hyperglycemia, unspecified: Secondary | ICD-10-CM

## 2019-03-29 DIAGNOSIS — IMO0001 Reserved for inherently not codable concepts without codable children: Secondary | ICD-10-CM

## 2019-03-29 DIAGNOSIS — Z91199 Patient's noncompliance with other medical treatment and regimen due to unspecified reason: Secondary | ICD-10-CM

## 2019-03-29 DIAGNOSIS — R7309 Other abnormal glucose: Secondary | ICD-10-CM | POA: Diagnosis not present

## 2019-03-29 DIAGNOSIS — Z9119 Patient's noncompliance with other medical treatment and regimen: Secondary | ICD-10-CM

## 2019-03-29 NOTE — Progress Notes (Signed)
This is a Pediatric Specialist E-Visit follow up consult provided via  [employer] [name] consented to an E-Visit consult today.  Location of patient: [name] is at home Location of provider: Gretchen Short FNP is at Pediatric Specialist office Patient was referred by No ref. provider found   The following participants were involved in this E-Visit: [name], Mertie Moores RMA, Gretchen Short FNP  Chief Complain/ Reason for E-Visit today: Diabetes type 1 follow up   Total time on call: This visit lasted >25 minutes. More then 50% of the visit was devoted to counseling.   Follow up: 2 months.   Pediatric Endocrinology Diabetes Consultation Follow-up Visit  [name] [birth year] [id number]  Chief Complaint: Follow-up type 1 diabetes   Patient, No Pcp Per   HPI: [name]  is a 23 y.o. male presenting for follow-up of type 1 diabetes.   1. [name] was admitted to the pediatric intensive care unit of [facility] on 09/22/2006 for evaluation and treatment of new-onset type 1 diabetes mellitus, diabetic ketoacidosis, dehydration, and weight loss. He was started on Lantus as a basal insulin and Novolog aspart as a bolus insulin at mealtimes, bedtime, and 2 AM. He received additional diabetes education through our Diabetes Survival Skills Program and the [facility]. He was subsequently converted to an [employer] insulin pump.  2. Since last visit to [facility] on 01/2019, he has been well. No Er visits or hospitalizations   He was forced to move out of dorm at school due to COVID 19 closing down the schools. During the move and trying to figure out what would happen with all of his classes he stopped taking care of his diabetes. He was rarely checking blood sugars and was typically running high. He feels like now that things have settled down he is doing a little bit better. He estimates checking 2-3 x per day. When he does bolus it is usually after eating.    Taking 10 mg of lisinopril per day.   Insulin regimen: Medtronic 670g insulin pump  Basal Rates 12AM 1.20  4am 1.25  8am 1.45  2pm 1.35  8pm 1.45    Insulin to Carbohydrate Ratio 12AM 12  7am 9  5pm 8          Insulin Sensitivity Factor 12AM 45  5am 40  1030am 30  7pm 35      Target Blood Glucose 12AM 150  6am 120  9pm 150           Hypoglycemia: Able to feel low blood sugars.  No glucagon needed recently.  Insulin pump download:   - Manual review done with patient via telephone.  Med-alert ID: Not currently wearing. Injection sites: back and buttocks.  Annual labs due: 05/2019  Ophthalmology due: 2019. Discussed importance with patient today.     3. ROS: Greater than 10 systems reviewed with pertinent positives listed in HPI, otherwise neg. Review of Systems  Constitutional: Negative.  Reports good energy and appetite. Sleeping well.  HENT: Negative.   Eyes: Negative.  Negative for blurred vision and double vision.       Wears glasses   Respiratory: Negative for cough and shortness of breath.   Cardiovascular: Negative for chest pain and palpitations.  Gastrointestinal: Negative for abdominal pain, constipation and diarrhea.  Genitourinary: Negative for frequency.  Musculoskeletal: Negative for neck pain.  Skin: Negative for itching and rash.  Neurological: Negative for tremors, sensory change, weakness and headaches.  Endo/Heme/Allergies: Negative for polydipsia.  Psychiatric/Behavioral: Negative for depression. The patient is not nervous/anxious.      Past Medical History:   Past Medical History:  Diagnosis Date  . Diabetes mellitus   . Goiter   . Goiter   . Hypoglycemia associated with diabetes (HCC)   . Hypoglycemia associated with diabetes (HCC)   . Physical growth delay   . Thyroiditis, autoimmune     Medications:  Outpatient Encounter Medications as of 03/29/2019  Medication Sig  . glucose blood (BAYER CONTOUR NEXT TEST) test strip  Check blood sugar 6-8 times per day  . insulin aspart (NOVOLOG) 100 UNIT/ML injection INJECT 300 UNITS EVERY 48 HOURS VIA INSULIN PUMP  . Lancets (ACCU-CHEK MULTICLIX) lancets Use to test blood sugars 10 x daily  . lisinopril (PRINIVIL,ZESTRIL) 10 MG tablet Take 1 tablet (10 mg total) by mouth daily.  Marland Kitchen glucagon (GLUCAGON EMERGENCY) 1 MG injection Inject 1 mg intramuscularly in thigh one time for severe hypoglycemia (Patient taking differently: Inject 1 mg into the muscle once as needed (for severe hypoglycemia). )  . oseltamivir (TAMIFLU) 75 MG capsule Take 1 capsule (75 mg total) by mouth 2 (two) times daily. (Patient not taking: Reported on 06/12/2018)   No facility-administered encounter medications on file as of 03/29/2019.     Allergies: No Known Allergies  Surgical History: Past Surgical History:  Procedure Laterality Date  . CIRCUMCISION      Family History:  Family History  Problem Relation Age of Onset  . Diabetes Paternal Grandmother   . Heart disease Paternal Grandmother   . Diabetes Paternal Grandfather   . Heart disease Paternal Grandfather   . Cancer Paternal Grandfather       Social History: Lives with: Parents. Commutes to school daily  Currently in junior year of college.    Physical Exam:  There were no vitals filed for this visit. There were no vitals taken for this visit. Body mass index: body mass index is unknown because there is no height or weight on file. Growth percentile SmartLinks can only be used for patients less than 45 years old.  Ht Readings from Last 3 Encounters:  02/15/19 5' 9.17" (1.757 m)  01/14/19 5' 8.9" (1.75 m)  10/22/18 5' 9.09" (1.755 m)   Wt Readings from Last 3 Encounters:  02/15/19 183 lb (83 kg)  01/14/19 182 lb 3.2 oz (82.6 kg)  10/22/18 176 lb 9.6 oz (80.1 kg)    PHYSICAL EXAM: General: Well developed, well nourished male in no acute distress.  Alert and oriented.  Head: Normocephalic, atraumatic.   Eyes:  Pupils  equal and round. EOMI.  Sclera white.  No eye drainage.   Ears/Nose/Mouth/Throat: Nares patent, no nasal drainage.  Normal dentition,   Neck: No obvious thyromegaly.  Cardiovascular:No cyanosis.  Respiratory: No increased work of breathing.  Skin: warm, dry.  No rash or lesions. Neurologic: alert and oriented, normal speech, no tremor    Labs: Last hemoglobin A1c: >14 on 12/2018   Assessment/Plan: Royden is a 23 y.o. male with type 1 diabetes in poor control on insulin pump therapy. He has struggled to make changes/improvements. Not checking blood sugars frequently enough and missing boluses. Would benefit from CGm therapy.   1-3. DM w/o complication type I, uncontrolled (HCC)/Hyperglycemia/elevated a1c  - Discussed and reviewed blood sugars via insulin pump.  - Encouraged to bolus prior to eating to limit blood sugar spikes.  - Rotate injection sites to prevent scar tissue.  - Reviewed carb counting.  -  Discussed sick day protocol. Discussed how COVID 19 effects diabetes.  - Discussed CGM therapy.   4. Essential hypertension - 10 mg of Lisinopril per day.   5. Maladaptive health behaviors affecting medical condition 6. Noncompliance  - Discussed barriers to care.  - Answered questions.   Follow-up: 2 months.   When a patient is on insulin, intensive monitoring of blood glucose levels is necessary to avoid hyperglycemia and hypoglycemia. Severe hyperglycemia/hypoglycemia can lead to hospital admissions and be life threatening.       Gretchen Short,  FNP-C  Pediatric Specialist  [address]  [place], [address]  Tele: [phone]

## 2019-03-29 NOTE — BH Specialist Note (Signed)
Integrated Behavioral Health via Telemedicine Video Visit  03/29/2019 [name] [id number]  Session Start time: 11:28 AM  Session End time: 11:41 AM Total time: 13 minutes  Referring Provider: Gretchen Short, NP Type of Visit: Video Patient/Family location: pt's home [facility] Provider location: Pediatric Specialists- [place] office All persons participating in visit: [name], Suda. Parv Manthey  Any changes to demographics: No- confirmed by staff Confirmed patient's insurance: confirmed by front staff Any changes to patient's insurance: No   Discussed confidentiality: Yes   I connected with Thurston Hole by a video enabled telemedicine application and verified that I am speaking with the correct person.    I discussed the limitations of evaluation and management by telemedicine and the availability of in person appointments.  I discussed that the purpose of this visit is to provide behavioral health care while limiting exposure to the novel coronavirus.   Discussed there is a possibility of technology failure and discussed alternative modes of communication if that failure occurs. I discussed that engaging in this video visit, they consent to the provision of behavioral healthcare and the services will be billed under their insurance.  Patient and/or legal guardian expressed understanding and consented to video visit: Yes   PRESENTING CONCERNS: Patient and/or family reports the following symptoms/concerns: had a "dip" in doing diabetes care when everything was changing with routine as school went online and work changed. Trying to get back to it now- using accountability system of tic marks on white board every time he does a BG check during the day. In routine of walking usually 1x in the morning and 1x in the afternoon with mom. All college classes are online now and feels like he is adjusting to that. 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 Duration of problem: months; Severity  of problem: mild  STRENGTHS (Protective Factors/Coping Skills): Back in routine Exercising regularly Supportive family Good social network  GOALS ADDRESSED: Patient will: 1.  Demonstrate ability to: Increase motivation to adhere to plan of care and Improve medication compliance  INTERVENTIONS: Interventions utilized:  Motivational Interviewing Standardized Assessments completed: Not Needed  ASSESSMENT: Patient currently experiencing ongoing work on regular diabetes care as noted above. He had trouble during the initial adjustment to changes due to Covid-19. Trying to hold himself more accountable now and in a better routine. [facility] praised efforts to get back on track and worked with Orvin to continue identifying ways to continue his work.   Patient may benefit from continuing regular diabetes care even when other routines change.  PLAN: 1. Follow up with behavioral health clinician on : joint with [name] 2. Behavioral recommendations: Keep using visuals to hold yourself accountable for checks. Keep exercising and doing other activities instead of eating if bored.  3. Referral(s): Integrated [employer] (In Clinic)  I discussed the assessment and treatment plan with the patient. They were provided an opportunity to ask questions and all were answered. They agreed with the plan and demonstrated an understanding of the instructions.   They were advised to call back or seek an in-person evaluation if the symptoms worsen or if the condition fails to improve as anticipated.  Shalawn Wynder E  No charge due to length of visit

## 2019-03-29 NOTE — Patient Instructions (Signed)
-  Always have fast sugar with you in case of low blood sugar (glucose tabs, regular juice or soda, candy) -Always wear your ID that states you have diabetes -Always bring your meter to your visit -Call/Email if you want to review blood sugars   

## 2019-04-14 DIAGNOSIS — E109 Type 1 diabetes mellitus without complications: Secondary | ICD-10-CM | POA: Diagnosis not present

## 2019-04-14 DIAGNOSIS — E1065 Type 1 diabetes mellitus with hyperglycemia: Secondary | ICD-10-CM | POA: Diagnosis not present

## 2019-05-20 DIAGNOSIS — E1065 Type 1 diabetes mellitus with hyperglycemia: Secondary | ICD-10-CM | POA: Diagnosis not present

## 2019-05-20 DIAGNOSIS — E109 Type 1 diabetes mellitus without complications: Secondary | ICD-10-CM | POA: Diagnosis not present

## 2019-05-24 DIAGNOSIS — E109 Type 1 diabetes mellitus without complications: Secondary | ICD-10-CM | POA: Diagnosis not present

## 2019-05-24 DIAGNOSIS — E1065 Type 1 diabetes mellitus with hyperglycemia: Secondary | ICD-10-CM | POA: Diagnosis not present

## 2019-07-16 DIAGNOSIS — E109 Type 1 diabetes mellitus without complications: Secondary | ICD-10-CM | POA: Diagnosis not present

## 2019-07-16 DIAGNOSIS — E1065 Type 1 diabetes mellitus with hyperglycemia: Secondary | ICD-10-CM | POA: Diagnosis not present

## 2019-12-02 DIAGNOSIS — E103291 Type 1 diabetes mellitus with mild nonproliferative diabetic retinopathy without macular edema, right eye: Secondary | ICD-10-CM | POA: Diagnosis not present

## 2019-12-02 LAB — HM DIABETES EYE EXAM

## 2020-02-01 DIAGNOSIS — L280 Lichen simplex chronicus: Secondary | ICD-10-CM | POA: Diagnosis not present

## 2020-02-17 DIAGNOSIS — E1065 Type 1 diabetes mellitus with hyperglycemia: Secondary | ICD-10-CM | POA: Diagnosis not present

## 2020-02-17 DIAGNOSIS — E109 Type 1 diabetes mellitus without complications: Secondary | ICD-10-CM | POA: Diagnosis not present

## 2020-02-18 DIAGNOSIS — E109 Type 1 diabetes mellitus without complications: Secondary | ICD-10-CM | POA: Diagnosis not present

## 2020-02-18 DIAGNOSIS — E1065 Type 1 diabetes mellitus with hyperglycemia: Secondary | ICD-10-CM | POA: Diagnosis not present

## 2020-03-17 DIAGNOSIS — B078 Other viral warts: Secondary | ICD-10-CM | POA: Diagnosis not present

## 2020-03-17 DIAGNOSIS — L280 Lichen simplex chronicus: Secondary | ICD-10-CM | POA: Diagnosis not present

## 2020-04-21 DIAGNOSIS — L280 Lichen simplex chronicus: Secondary | ICD-10-CM | POA: Diagnosis not present

## 2020-04-21 DIAGNOSIS — B078 Other viral warts: Secondary | ICD-10-CM | POA: Diagnosis not present

## 2020-05-26 DIAGNOSIS — L700 Acne vulgaris: Secondary | ICD-10-CM | POA: Diagnosis not present

## 2020-05-26 DIAGNOSIS — B078 Other viral warts: Secondary | ICD-10-CM | POA: Diagnosis not present

## 2020-05-26 DIAGNOSIS — L280 Lichen simplex chronicus: Secondary | ICD-10-CM | POA: Diagnosis not present

## 2020-05-30 DIAGNOSIS — E109 Type 1 diabetes mellitus without complications: Secondary | ICD-10-CM | POA: Diagnosis not present

## 2020-05-30 DIAGNOSIS — E1065 Type 1 diabetes mellitus with hyperglycemia: Secondary | ICD-10-CM | POA: Diagnosis not present

## 2020-06-23 ENCOUNTER — Other Ambulatory Visit (INDEPENDENT_AMBULATORY_CARE_PROVIDER_SITE_OTHER): Payer: Self-pay | Admitting: Family

## 2020-06-29 DIAGNOSIS — B078 Other viral warts: Secondary | ICD-10-CM | POA: Diagnosis not present

## 2020-07-24 DIAGNOSIS — E1065 Type 1 diabetes mellitus with hyperglycemia: Secondary | ICD-10-CM | POA: Diagnosis not present

## 2020-08-16 DIAGNOSIS — Z794 Long term (current) use of insulin: Secondary | ICD-10-CM | POA: Diagnosis not present

## 2020-08-16 DIAGNOSIS — E559 Vitamin D deficiency, unspecified: Secondary | ICD-10-CM | POA: Diagnosis not present

## 2020-08-16 DIAGNOSIS — E1065 Type 1 diabetes mellitus with hyperglycemia: Secondary | ICD-10-CM | POA: Diagnosis not present

## 2020-08-16 DIAGNOSIS — R7401 Elevation of levels of liver transaminase levels: Secondary | ICD-10-CM | POA: Diagnosis not present

## 2020-08-21 DIAGNOSIS — R749 Abnormal serum enzyme level, unspecified: Secondary | ICD-10-CM | POA: Diagnosis not present

## 2020-09-27 DIAGNOSIS — R7401 Elevation of levels of liver transaminase levels: Secondary | ICD-10-CM | POA: Diagnosis not present

## 2020-09-27 DIAGNOSIS — Z794 Long term (current) use of insulin: Secondary | ICD-10-CM | POA: Diagnosis not present

## 2020-09-27 DIAGNOSIS — E559 Vitamin D deficiency, unspecified: Secondary | ICD-10-CM | POA: Diagnosis not present

## 2020-09-27 DIAGNOSIS — E1065 Type 1 diabetes mellitus with hyperglycemia: Secondary | ICD-10-CM | POA: Diagnosis not present

## 2020-10-17 DIAGNOSIS — E1065 Type 1 diabetes mellitus with hyperglycemia: Secondary | ICD-10-CM | POA: Diagnosis not present

## 2020-10-17 DIAGNOSIS — E109 Type 1 diabetes mellitus without complications: Secondary | ICD-10-CM | POA: Diagnosis not present

## 2021-01-18 DIAGNOSIS — Z20822 Contact with and (suspected) exposure to covid-19: Secondary | ICD-10-CM | POA: Diagnosis not present

## 2021-01-31 DIAGNOSIS — E109 Type 1 diabetes mellitus without complications: Secondary | ICD-10-CM | POA: Diagnosis not present

## 2021-01-31 DIAGNOSIS — E785 Hyperlipidemia, unspecified: Secondary | ICD-10-CM | POA: Diagnosis not present

## 2021-01-31 DIAGNOSIS — Z23 Encounter for immunization: Secondary | ICD-10-CM | POA: Diagnosis not present

## 2021-01-31 DIAGNOSIS — G5623 Lesion of ulnar nerve, bilateral upper limbs: Secondary | ICD-10-CM | POA: Diagnosis not present

## 2021-01-31 DIAGNOSIS — Z0000 Encounter for general adult medical examination without abnormal findings: Secondary | ICD-10-CM | POA: Diagnosis not present

## 2021-01-31 DIAGNOSIS — M545 Low back pain, unspecified: Secondary | ICD-10-CM | POA: Diagnosis not present

## 2021-01-31 DIAGNOSIS — E559 Vitamin D deficiency, unspecified: Secondary | ICD-10-CM | POA: Diagnosis not present

## 2021-07-06 DIAGNOSIS — S83241A Other tear of medial meniscus, current injury, right knee, initial encounter: Secondary | ICD-10-CM | POA: Diagnosis not present

## 2021-07-14 DIAGNOSIS — M25561 Pain in right knee: Secondary | ICD-10-CM | POA: Diagnosis not present

## 2021-07-16 DIAGNOSIS — M25561 Pain in right knee: Secondary | ICD-10-CM | POA: Diagnosis not present

## 2021-08-17 DIAGNOSIS — B078 Other viral warts: Secondary | ICD-10-CM | POA: Diagnosis not present

## 2021-09-14 DIAGNOSIS — L818 Other specified disorders of pigmentation: Secondary | ICD-10-CM | POA: Diagnosis not present

## 2021-09-14 DIAGNOSIS — L700 Acne vulgaris: Secondary | ICD-10-CM | POA: Diagnosis not present

## 2021-09-14 DIAGNOSIS — B078 Other viral warts: Secondary | ICD-10-CM | POA: Diagnosis not present

## 2021-10-13 DIAGNOSIS — E1065 Type 1 diabetes mellitus with hyperglycemia: Secondary | ICD-10-CM | POA: Diagnosis not present

## 2021-10-26 DIAGNOSIS — L818 Other specified disorders of pigmentation: Secondary | ICD-10-CM | POA: Diagnosis not present

## 2021-10-26 DIAGNOSIS — L700 Acne vulgaris: Secondary | ICD-10-CM | POA: Diagnosis not present

## 2021-10-26 DIAGNOSIS — B078 Other viral warts: Secondary | ICD-10-CM | POA: Diagnosis not present

## 2021-11-23 DIAGNOSIS — B078 Other viral warts: Secondary | ICD-10-CM | POA: Diagnosis not present

## 2021-12-07 DIAGNOSIS — E109 Type 1 diabetes mellitus without complications: Secondary | ICD-10-CM | POA: Diagnosis not present

## 2021-12-07 DIAGNOSIS — E1065 Type 1 diabetes mellitus with hyperglycemia: Secondary | ICD-10-CM | POA: Diagnosis not present

## 2021-12-14 DIAGNOSIS — E1065 Type 1 diabetes mellitus with hyperglycemia: Secondary | ICD-10-CM | POA: Diagnosis not present

## 2021-12-14 DIAGNOSIS — E109 Type 1 diabetes mellitus without complications: Secondary | ICD-10-CM | POA: Diagnosis not present

## 2021-12-18 DIAGNOSIS — E109 Type 1 diabetes mellitus without complications: Secondary | ICD-10-CM | POA: Diagnosis not present

## 2021-12-18 DIAGNOSIS — E1065 Type 1 diabetes mellitus with hyperglycemia: Secondary | ICD-10-CM | POA: Diagnosis not present

## 2021-12-20 DIAGNOSIS — E559 Vitamin D deficiency, unspecified: Secondary | ICD-10-CM | POA: Diagnosis not present

## 2021-12-20 DIAGNOSIS — E109 Type 1 diabetes mellitus without complications: Secondary | ICD-10-CM | POA: Diagnosis not present

## 2021-12-20 DIAGNOSIS — R7401 Elevation of levels of liver transaminase levels: Secondary | ICD-10-CM | POA: Diagnosis not present

## 2021-12-20 DIAGNOSIS — E1065 Type 1 diabetes mellitus with hyperglycemia: Secondary | ICD-10-CM | POA: Diagnosis not present

## 2021-12-20 DIAGNOSIS — Z794 Long term (current) use of insulin: Secondary | ICD-10-CM | POA: Diagnosis not present

## 2022-01-25 DIAGNOSIS — R208 Other disturbances of skin sensation: Secondary | ICD-10-CM | POA: Diagnosis not present

## 2022-01-25 DIAGNOSIS — L700 Acne vulgaris: Secondary | ICD-10-CM | POA: Diagnosis not present

## 2022-01-25 DIAGNOSIS — L818 Other specified disorders of pigmentation: Secondary | ICD-10-CM | POA: Diagnosis not present

## 2022-01-25 DIAGNOSIS — B078 Other viral warts: Secondary | ICD-10-CM | POA: Diagnosis not present

## 2022-01-31 DIAGNOSIS — Z0001 Encounter for general adult medical examination with abnormal findings: Secondary | ICD-10-CM | POA: Diagnosis not present

## 2022-01-31 DIAGNOSIS — Z6831 Body mass index (BMI) 31.0-31.9, adult: Secondary | ICD-10-CM | POA: Diagnosis not present

## 2022-01-31 DIAGNOSIS — E1065 Type 1 diabetes mellitus with hyperglycemia: Secondary | ICD-10-CM | POA: Diagnosis not present

## 2022-01-31 DIAGNOSIS — E109 Type 1 diabetes mellitus without complications: Secondary | ICD-10-CM | POA: Diagnosis not present

## 2022-01-31 DIAGNOSIS — E785 Hyperlipidemia, unspecified: Secondary | ICD-10-CM | POA: Diagnosis not present

## 2022-01-31 DIAGNOSIS — E559 Vitamin D deficiency, unspecified: Secondary | ICD-10-CM | POA: Diagnosis not present

## 2022-06-05 NOTE — Progress Notes (Deleted)
    Aleen Sells D.[facility] [address] [place] [address] Phone: [phone]   Assessment and Plan:     There are no diagnoses linked to this encounter.  ***   Pertinent previous records reviewed include ***   Follow Up: ***     Subjective:   I, [name], am serving as a Neurosurgeon for Doctor Richardean Sale  Chief Complaint: Right knee pain   HPI:   06/06/2022 Patient is a 26 year old male complaining of right knee pain. Patient states  Relevant Historical Information: ***  Additional pertinent review of systems negative.   Current Outpatient Medications:    glucagon (GLUCAGON EMERGENCY) 1 MG injection, Inject 1 mg intramuscularly in thigh one time for severe hypoglycemia (Patient taking differently: Inject 1 mg into the muscle once as needed (for severe hypoglycemia). ), Disp: 2 each, Rfl: 3   glucose blood (BAYER CONTOUR NEXT TEST) test strip, Check blood sugar 6-8 times per day, Disp: 300 each, Rfl: 6   insulin aspart (NOVOLOG) 100 UNIT/ML injection, INJECT 300 UNITS EVERY 48 HOURS VIA INSULIN PUMP, Disp: 120 mL, Rfl: 4   Lancets (ACCU-CHEK MULTICLIX) lancets, Use to test blood sugars 10 x daily, Disp: 100 each, Rfl: 6   lisinopril (PRINIVIL,ZESTRIL) 10 MG tablet, Take 1 tablet (10 mg total) by mouth daily., Disp: 30 tablet, Rfl: 3   oseltamivir (TAMIFLU) 75 MG capsule, Take 1 capsule (75 mg total) by mouth 2 (two) times daily. (Patient not taking: Reported on 06/12/2018), Disp: 10 capsule, Rfl: 0   Objective:     There were no vitals filed for this visit.    There is no height or weight on file to calculate BMI.    Physical Exam:    ***   Electronically signed by:  Aleen Sells D.[facility] 7:50 AM 06/05/22

## 2022-06-06 ENCOUNTER — Ambulatory Visit: Payer: BC Managed Care – PPO | Admitting: Sports Medicine
